# Patient Record
Sex: Female | Born: 1955 | Hispanic: No | Smoking: Never smoker
Health system: Southern US, Community
[De-identification: ages and names within clinical notes are randomized; demographics above are authoritative.]

## PROBLEM LIST (undated history)

## (undated) DIAGNOSIS — Z950 Presence of cardiac pacemaker: Secondary | ICD-10-CM

## (undated) DIAGNOSIS — I10 Essential (primary) hypertension: Secondary | ICD-10-CM

## (undated) DIAGNOSIS — Z95810 Presence of automatic (implantable) cardiac defibrillator: Secondary | ICD-10-CM

## (undated) DIAGNOSIS — G510 Bell's palsy: Secondary | ICD-10-CM

## (undated) DIAGNOSIS — I4891 Unspecified atrial fibrillation: Principal | ICD-10-CM

## (undated) HISTORY — PX: CHOLECYSTECTOMY: SHX55

## (undated) HISTORY — PX: TUBAL LIGATION: SHX77

---

## 2006-09-06 DIAGNOSIS — G510 Bell's palsy: Secondary | ICD-10-CM

## 2006-09-06 HISTORY — DX: Bell's palsy: G51.0

## 2007-09-07 DIAGNOSIS — Z95810 Presence of automatic (implantable) cardiac defibrillator: Secondary | ICD-10-CM

## 2007-09-07 DIAGNOSIS — I4891 Unspecified atrial fibrillation: Secondary | ICD-10-CM

## 2007-09-07 HISTORY — DX: Unspecified atrial fibrillation: I48.91

## 2007-09-07 HISTORY — PX: PACEMAKER INSERTION: SHX728

## 2007-09-07 HISTORY — DX: Presence of automatic (implantable) cardiac defibrillator: Z95.810

## 2011-12-07 ENCOUNTER — Encounter (HOSPITAL_COMMUNITY): Payer: Self-pay | Admitting: *Deleted

## 2011-12-07 ENCOUNTER — Emergency Department (INDEPENDENT_AMBULATORY_CARE_PROVIDER_SITE_OTHER)
Admission: EM | Admit: 2011-12-07 | Discharge: 2011-12-07 | Disposition: A | Discharge status: Home / Self Care | Payer: Medicare Other | Source: Home / Self Care | Attending: Family Medicine | Admitting: Family Medicine

## 2011-12-07 DIAGNOSIS — I499 Cardiac arrhythmia, unspecified: Secondary | ICD-10-CM

## 2011-12-07 DIAGNOSIS — Z7901 Long term (current) use of anticoagulants: Secondary | ICD-10-CM

## 2011-12-07 DIAGNOSIS — K2970 Gastritis, unspecified, without bleeding: Secondary | ICD-10-CM

## 2011-12-07 DIAGNOSIS — K2990 Gastroduodenitis, unspecified, without bleeding: Secondary | ICD-10-CM

## 2011-12-07 HISTORY — DX: Essential (primary) hypertension: I10

## 2011-12-07 HISTORY — DX: Presence of cardiac pacemaker: Z95.0

## 2011-12-07 MED ORDER — WARFARIN SODIUM 1 MG PO TABS: ORAL_TABLET | ORAL | Status: DC

## 2011-12-07 NOTE — ED Notes (Signed)
Pt lives in [place] and needs PT/INR Q 2 weeks due to taking coumadin.    She has an Rx from her Dr for this since she is visiting family here.  She also c/o 4 days of epigastric pain and mid back pain which is burning.  The pain is worse after eating

## 2011-12-07 NOTE — Discharge Instructions (Signed)
Take an additional 2.5 mg tablet in addition to your normal 5 mg tablet tonight. Take one of the 1 mg tablets in addition to your 5 mg tablet tomorrow night. Then continue your normal regimen. Please present here next Tuesday or Wednesday for another INR check, with Dr. Juanetta Gosling. Return to care, sooner should, your symptoms worsen or you experience any spontaneous bleeding.  For your stomach pain, you may increase your Zantac dose to 150 mg twice daily for up to two weeks to see if your symptoms improve. Return to care should your symptoms not improve, or worsen in any way.

## 2011-12-07 NOTE — ED Provider Notes (Signed)
History     CSN: [id number]  Arrival date & time 12/07/11  8657   First MD Initiated Contact with Patient 12/07/11 1011      Chief Complaint  Patient presents with  . Medication Refill    (Consider location/radiation/quality/duration/timing/severity/associated sxs/prior treatment) HPI Comments: Beth May presents for the need for laboratory results. She is visiting here from [place] and has been here since December. She's currently taking warfarin for an arrhythmia and also has a pacemaker. She presents a prescription from her cardiologist requesting biweekly INR checks. She misunderstood the directions and has not had her INR checked as of yet. This was supposed to occur starting in December. We will check it today. She denies any chest complaints. She does report some epigastric discomfort. This sometimes occurs after eating.  Patient is a 56 y.o. female presenting with general illness. The history is provided by the patient.  Illness  The current episode started more than 2 weeks ago. The problem has been unchanged. The symptoms are relieved by nothing. The symptoms are aggravated by nothing. Associated symptoms include abdominal pain. Pertinent negatives include no fever.    Past Medical History  Diagnosis Date  . Pacemaker   . Hypertension     Past Surgical History  Procedure Date  . Pacemaker insertion   . Cesarean section     Family History  Problem Relation Age of Onset  . Cancer Mother     History  Substance Use Topics  . Smoking status: Never Smoker   . Smokeless tobacco: Not on file  . Alcohol Use: No    OB History    Grav Para Term Preterm Abortions TAB SAB Ect Mult Living                  Review of Systems  Constitutional: Negative.  Negative for fever.  HENT: Negative.   Eyes: Negative.   Respiratory: Negative.  Negative for chest tightness.   Cardiovascular: Negative.  Negative for chest pain, palpitations and leg swelling.  Gastrointestinal:  Positive for abdominal pain.  Genitourinary: Negative.   Musculoskeletal: Negative.   Skin: Negative.   Neurological: Negative.     Allergies  Review of patient's allergies indicates no known allergies.  Home Medications   Current Outpatient Rx  Name Route Sig Dispense Refill  . FUROSEMIDE 20 MG PO TABS Oral Take 20 mg by mouth 2 (two) times daily.    Marland Kitchen POTASSIUM CHLORIDE ER 10 MEQ PO TBCR Oral Take 10 mEq by mouth 2 (two) times daily.    . WARFARIN SODIUM 2.5 MG PO TABS Oral Take 2.5 mg by mouth as directed.    . WARFARIN SODIUM 5 MG PO TABS Oral Take 5 mg by mouth daily.    . WARFARIN SODIUM 1 MG PO TABS  Take one tablet in addition to your normal 5 mg tomorrow night; and as directed by your physician. 30 tablet 0    BP 120/82  Pulse 71  Temp(Src) 98.1 F (36.7 C) (Oral)  Resp 16  SpO2 9%  Physical Exam  Nursing note and vitals reviewed. Constitutional: She is oriented to person, place, and time. She appears well-developed and well-nourished.  HENT:  Head: Normocephalic and atraumatic.  Eyes: EOM are normal.  Neck: Normal range of motion.  Cardiovascular: Normal rate, regular rhythm and normal heart sounds.   Pulmonary/Chest: Effort normal and breath sounds normal. She has no wheezes. She has no rales.  Abdominal: Soft. Normal appearance and bowel sounds are normal.  There is no tenderness.  Musculoskeletal: Normal range of motion.  Neurological: She is alert and oriented to person, place, and time.  Skin: Skin is warm and dry.  Psychiatric: Her behavior is normal.    ED Course  Procedures (including critical care time)  Labs Reviewed  PROTIME-INR - Abnormal; Notable for the following:    Prothrombin Time 18.5 (*)    INR 1.51 (*)    All other components within normal limits   No results found.   1. Arrhythmia   2. Warfarin anticoagulation   3. Gastritis       MDM  Adjusted warfarin dosage by 10 percent, for subtherapeutic INR; will return Tuesday for  INR check        Renaee Munda, MD 12/07/11 1235

## 2011-12-14 ENCOUNTER — Emergency Department (INDEPENDENT_AMBULATORY_CARE_PROVIDER_SITE_OTHER)
Admission: EM | Admit: 2011-12-14 | Discharge: 2011-12-14 | Disposition: A | Discharge status: Home / Self Care | Payer: Medicare Other | Source: Home / Self Care | Attending: Family Medicine | Admitting: Family Medicine

## 2011-12-14 ENCOUNTER — Encounter (HOSPITAL_COMMUNITY): Payer: Self-pay

## 2011-12-14 DIAGNOSIS — Z7901 Long term (current) use of anticoagulants: Secondary | ICD-10-CM

## 2011-12-14 LAB — PROTIME-INR: Prothrombin Time: 23.3 seconds — ABNORMAL HIGH (ref 11.6–15.2)

## 2011-12-14 NOTE — ED Notes (Signed)
Pt states she was seen here 1 week ago. Was told by Dr Juanetta Gosling to return to have bleeding time recheck.  States he adjusted the dose of her medicine.

## 2011-12-14 NOTE — Discharge Instructions (Signed)
I need to increase the dose of your coumadin medication today. Tonight, take an additional 1 mg tablet of coumadin in addition to your regular 5 mg tablet. Repeat this tomorrow night, taking a 1 mg tablet in addition to your regular 5 mg tablet. After tomorrow, continue with your regular dosing, 5 mg each night except for Sunday, where you take 2.5 mg. Return to care on Monday or Thursday for re-evaluation with Dr. Juanetta Gosling and to have your INR checked.

## 2011-12-14 NOTE — ED Provider Notes (Signed)
History     CSN: [id number]  Arrival date & time 12/14/11  0945   First MD Initiated Contact with Patient 12/14/11 1050      Chief Complaint  Patient presents with  . Labs Only    (Consider location/radiation/quality/duration/timing/severity/associated sxs/prior treatment) HPI Comments: Beth May presents today for reevaluation of her Coumadin regimen. She was seen last week by this provider for an INR check. She is here visiting her family from [place] since December. At that time she presented a prescription from her cardiologist requesting INR checks every 2 weeks for 6 weeks. She had misunderstood the directions and presented for the first time in March with no previous INR checks since November. INR that time was 1.5. Her regimen was increased by 2 mg and she was told to followup today. INR today is 2.03. Will increase regimen by 10% again and have her followup in one week.  Patient is a 56 y.o. female presenting with general illness. The history is provided by the patient.  Illness  The problem has been unchanged.    Past Medical History  Diagnosis Date  . Pacemaker   . Hypertension     Past Surgical History  Procedure Date  . Pacemaker insertion   . Cesarean section     Family History  Problem Relation Age of Onset  . Cancer Mother     History  Substance Use Topics  . Smoking status: Never Smoker   . Smokeless tobacco: Not on file  . Alcohol Use: No    OB History    Grav Para Term Preterm Abortions TAB SAB Ect Mult Living                  Review of Systems  Constitutional: Negative.   HENT: Negative.   Eyes: Negative.   Respiratory: Negative.   Cardiovascular: Negative.   Gastrointestinal: Negative.   Genitourinary: Negative.   Musculoskeletal: Negative.   Skin: Negative.   Neurological: Negative.     Allergies  Review of patient's allergies indicates no known allergies.  Home Medications   Current Outpatient Rx  Name Route Sig Dispense  Refill  . FUROSEMIDE 20 MG PO TABS Oral Take 20 mg by mouth 2 (two) times daily.    Marland Kitchen POTASSIUM CHLORIDE ER 10 MEQ PO TBCR Oral Take 10 mEq by mouth 2 (two) times daily.    . WARFARIN SODIUM 1 MG PO TABS  Take one tablet in addition to your normal 5 mg tomorrow night; and as directed by your physician. 30 tablet 0  . WARFARIN SODIUM 2.5 MG PO TABS Oral Take 2.5 mg by mouth as directed.    . WARFARIN SODIUM 5 MG PO TABS Oral Take 5 mg by mouth daily.      BP 109/78  Pulse 72  Temp(Src) 98.3 F (36.8 C) (Oral)  Resp 16  SpO2 96%  Physical Exam  Nursing note and vitals reviewed. Constitutional: She is oriented to person, place, and time. She appears well-developed and well-nourished.  HENT:  Head: Normocephalic and atraumatic.  Eyes: EOM are normal.  Neck: Normal range of motion.  Pulmonary/Chest: Effort normal.  Musculoskeletal: Normal range of motion.  Neurological: She is alert and oriented to person, place, and time.  Skin: Skin is warm and dry.  Psychiatric: Her behavior is normal.    ED Course  Procedures (including critical care time)  Labs Reviewed  PROTIME-INR - Abnormal; Notable for the following:    Prothrombin Time 23.3 (*)  INR 2.03 (*)    All other components within normal limits   No results found.   1. Warfarin anticoagulation       MDM  Labs reviewed; improvement in INR with increase of 2 mg last week; will increase 1 mg each night, tonight and tomorrow; will re-check in 1 week       Renaee Munda, MD 12/14/11 [phone]

## 2011-12-20 ENCOUNTER — Encounter (HOSPITAL_COMMUNITY): Payer: Self-pay

## 2011-12-20 ENCOUNTER — Emergency Department (INDEPENDENT_AMBULATORY_CARE_PROVIDER_SITE_OTHER)
Admission: EM | Admit: 2011-12-20 | Discharge: 2011-12-20 | Disposition: A | Discharge status: Home / Self Care | Payer: Medicare Other | Source: Home / Self Care | Attending: Family Medicine | Admitting: Family Medicine

## 2011-12-20 DIAGNOSIS — Z7901 Long term (current) use of anticoagulants: Secondary | ICD-10-CM

## 2011-12-20 LAB — PROTIME-INR: INR: 2.58 — ABNORMAL HIGH (ref 0.00–1.49)

## 2011-12-20 NOTE — ED Notes (Signed)
Was seen here last week by Dr Juanetta Gosling to have INR drawn with subsequent adjust in coumadin dosing.  Told to return to today for repeat INR

## 2011-12-20 NOTE — Discharge Instructions (Signed)
Regresa aqui a la clinica para una prueba de [place]; [employer] al lunes, el 29 de abril, con Dr. Juanetta Gosling, entre 8 en la manana y 4 en la tarde. Continua su medicina, con 5 mg de warfarin (Coumadin) cada noche.

## 2011-12-20 NOTE — ED Provider Notes (Signed)
History     CSN: [id number]  Arrival date & time 12/20/11  4098   First MD Initiated Contact with Patient 12/20/11 0957      Chief Complaint  Patient presents with  . Labs Only    (Consider location/radiation/quality/duration/timing/severity/associated sxs/prior treatment) HPI Comments: Beth May returns today for INR check. Her last reading was 2.03; her regimen was increased by 2.5 mg weekly. She is now taking 5 mg warfarin daily. She denies any complaints.   Patient is a 56 y.o. female presenting with general illness.  Illness  The current episode started more than 2 weeks ago. The problem has been unchanged.    Past Medical History  Diagnosis Date  . Pacemaker   . Hypertension     Past Surgical History  Procedure Date  . Pacemaker insertion   . Cesarean section     Family History  Problem Relation Age of Onset  . Cancer Mother     History  Substance Use Topics  . Smoking status: Never Smoker   . Smokeless tobacco: Not on file  . Alcohol Use: No    OB History    Grav Para Term Preterm Abortions TAB SAB Ect Mult Living                  Review of Systems  Constitutional: Negative.   HENT: Negative.   Eyes: Negative.   Respiratory: Negative.   Cardiovascular: Negative.   Gastrointestinal: Negative.   Genitourinary: Negative.   Musculoskeletal: Negative.   Skin: Negative.   Neurological: Negative.   Hematological: Negative.  Does not bruise/bleed easily.    Allergies  Review of patient's allergies indicates no known allergies.  Home Medications   Current Outpatient Rx  Name Route Sig Dispense Refill  . FUROSEMIDE 20 MG PO TABS Oral Take 20 mg by mouth 2 (two) times daily.    Marland Kitchen POTASSIUM CHLORIDE ER 10 MEQ PO TBCR Oral Take 10 mEq by mouth 2 (two) times daily.    . WARFARIN SODIUM 5 MG PO TABS Oral Take 5 mg by mouth daily.      BP 115/77  Pulse 72  Temp(Src) 98.4 F (36.9 C) (Oral)  Resp 14  SpO2 98%  Physical Exam  Nursing note and  vitals reviewed. Constitutional: She is oriented to person, place, and time. She appears well-developed and well-nourished.  HENT:  Head: Normocephalic and atraumatic.  Eyes: EOM are normal.  Neck: Normal range of motion.  Pulmonary/Chest: Effort normal.  Musculoskeletal: Normal range of motion.  Neurological: She is alert and oriented to person, place, and time.  Skin: Skin is warm and dry.  Psychiatric: Her behavior is normal.    ED Course  Procedures (including critical care time)  Labs Reviewed  PROTIME-INR - Abnormal; Notable for the following:    Prothrombin Time 28.1 (*)    INR 2.58 (*)    All other components within normal limits   No results found.   1. Warfarin anticoagulation       MDM  Labs reviewed; continue current regimen at warfarin 5 mg daily; return in 2 weeks for INR check        Renaee Munda, MD 12/20/11 1153

## 2011-12-21 NOTE — ED Notes (Signed)
4/9 Dr. Juanetta Gosling wants to know if the [facility] would see the pt. 4/11 Office closed. 4/15 Left message.  4/16 I explained to the registration clerk that pt. is visiting from [place] for the next 3 mos.  She said she thinks you have to be a [place] pt. to come to the clinic.  She transferred me to the VM. Left message to call. Vassie Moselle 12/21/2011

## 2011-12-23 NOTE — ED Notes (Signed)
Dr. Juanetta Gosling made aware coumadin clinic refuses to see pt without being a [facility] patient first.  Dr. Juanetta Gosling states he's stabilized and will see at [facility] one more time before she goes back to [facility].

## 2012-01-03 ENCOUNTER — Encounter (HOSPITAL_COMMUNITY): Payer: Self-pay

## 2012-01-03 ENCOUNTER — Emergency Department (INDEPENDENT_AMBULATORY_CARE_PROVIDER_SITE_OTHER)
Admission: EM | Admit: 2012-01-03 | Discharge: 2012-01-03 | Disposition: A | Discharge status: Home / Self Care | Payer: Medicare Other | Source: Home / Self Care | Attending: Family Medicine | Admitting: Family Medicine

## 2012-01-03 DIAGNOSIS — I4891 Unspecified atrial fibrillation: Secondary | ICD-10-CM

## 2012-01-03 DIAGNOSIS — Z7901 Long term (current) use of anticoagulants: Secondary | ICD-10-CM

## 2012-01-03 HISTORY — DX: Unspecified atrial fibrillation: I48.91

## 2012-01-03 LAB — PROTIME-INR
INR: 2.93 — ABNORMAL HIGH (ref 0.00–1.49)
Prothrombin Time: 31 seconds — ABNORMAL HIGH (ref 11.6–15.2)

## 2012-01-03 NOTE — ED Notes (Signed)
Pt here to have INR checked as instructed by Dr Juanetta Gosling.

## 2012-01-03 NOTE — ED Provider Notes (Signed)
History     CSN: [id number]  Arrival date & time 01/03/12  1016   First MD Initiated Contact with Patient 01/03/12 1039      Chief Complaint  Patient presents with  . Labs Only    (Consider location/radiation/quality/duration/timing/severity/associated sxs/prior treatment) HPI Comments: Seraiah returns today for recheck of her INR. She is visiting from [place] and has been here since December 2012. She is followed by her cardiologist in [place] and was recently started on warfarin in November 2012. She arrived in [place] with a prescription from her cardiologist to have biweekly warfarin checks. She misunderstood the instructions and had never had it checked. At that time, her regimen was warfarin 5 mg daily, M - Sat, and 2.5 mg on Sunday. She has been returning here to have it managed by this provider. After an increase of 3 mg (1 additional mg each night for 3 nights) during week 1, and then an additional 3 mg, the second week, increasing 10% weekly, the last 2 INRs were 2.5 and 2.93. She did experience a nosebleed once last week, and went back down to her previous regimen, where she only took 2.5 mg on Sunday. I advised her to continue with the new regimen, of 5 mg daily except for an increase to 3.5 mg on Sunday. I had prescribed 1 mg tablets. She will follow up here once more in two weeks, for one last INR check.  Patient is a 56 y.o. female presenting with general illness. The history is provided by the patient.  Illness  The current episode started more than 2 weeks ago.    Past Medical History  Diagnosis Date  . Pacemaker   . Hypertension   . Atrial fibrillation     Past Surgical History  Procedure Date  . Pacemaker insertion   . Cesarean section     Family History  Problem Relation Age of Onset  . Cancer Mother     History  Substance Use Topics  . Smoking status: Never Smoker   . Smokeless tobacco: Not on file  . Alcohol Use: No    OB History    Grav Para Term Preterm Abortions  TAB SAB Ect Mult Living                  Review of Systems  Constitutional: Negative.   HENT: Positive for nosebleeds.   Eyes: Negative.   Respiratory: Negative.   Cardiovascular: Negative.   Gastrointestinal: Negative.   Genitourinary: Negative.   Musculoskeletal: Negative.   Skin: Negative.   Neurological: Negative.     Allergies  Review of patient's allergies indicates no known allergies.  Home Medications   Current Outpatient Rx  Name Route Sig Dispense Refill  . FUROSEMIDE 20 MG PO TABS Oral Take 20 mg by mouth 2 (two) times daily.    Marland Kitchen POTASSIUM CHLORIDE ER 10 MEQ PO TBCR Oral Take 10 mEq by mouth 2 (two) times daily.    . WARFARIN SODIUM 5 MG PO TABS Oral Take 5 mg by mouth daily.      BP 125/71  Pulse 60  Temp(Src) 98.3 F (36.8 C) (Oral)  Resp 16  SpO2 98%  Physical Exam  Nursing note and vitals reviewed. Constitutional: She is oriented to person, place, and time. She appears well-developed and well-nourished.  HENT:  Head: Normocephalic and atraumatic.  Eyes: EOM are normal.  Neck: Normal range of motion.  Pulmonary/Chest: Effort normal.  Musculoskeletal: Normal range of motion.  Neurological: She is  alert and oriented to person, place, and time.  Skin: Skin is warm and dry.  Psychiatric: Her behavior is normal.    ED Course  Procedures (including critical care time)  Labs Reviewed  PROTIME-INR - Abnormal; Notable for the following:    Prothrombin Time 31.0 (*)    INR 2.93 (*)    All other components within normal limits  LAB REPORT - SCANNED   No results found.   1. Atrial fibrillation   2. Warfarin anticoagulation       MDM  Labs reviewed; INR stable, with last 2 values 2.5 and 2.93; will continue regimen of 5 mg daily, and 3.5 mg on Sunday; return in 2 weeks for recheck        Renaee Munda, MD 01/06/12 1318

## 2012-01-03 NOTE — Discharge Instructions (Signed)
Take 5 mg warfarin EVERY DAY, EXCEPT Sunday. On Sunday, take one of your regular 2.5 mg tablets, IN ADDITION TO a 1 mg tablet. So, on Sunday, you will take a total of 3.5 mg. Please return here to the [facility] for an INR check in 2 to 3 weeks. Return to care sooner than that time should your symptoms not improve, or worsen in any way, such as spontaneous bleeding, chest pain, difficulty breathing, or any other symptoms that might concern you.

## 2012-01-24 ENCOUNTER — Emergency Department (INDEPENDENT_AMBULATORY_CARE_PROVIDER_SITE_OTHER)
Admission: EM | Admit: 2012-01-24 | Discharge: 2012-01-24 | Disposition: A | Discharge status: Home / Self Care | Payer: Medicare Other | Source: Home / Self Care | Attending: Family Medicine | Admitting: Family Medicine

## 2012-01-24 ENCOUNTER — Encounter (HOSPITAL_COMMUNITY): Payer: Self-pay | Admitting: Cardiology

## 2012-01-24 DIAGNOSIS — Z5181 Encounter for therapeutic drug level monitoring: Secondary | ICD-10-CM

## 2012-01-24 LAB — PROTIME-INR: INR: 3.1 — ABNORMAL HIGH (ref 0.00–1.49)

## 2012-01-24 NOTE — Discharge Instructions (Signed)
Continue the same dose and dose schedule. Follow up 2 wks for recheck inr. It may be more convenient for your cardiologist to write a order for inr monitoring and you go to [facility] to have it drawn . They will then report results to your cardiologist and he can manage any changes.

## 2012-01-24 NOTE — ED Provider Notes (Signed)
History     CSN: [id number]  Arrival date & time 01/24/12  0917   First MD Initiated Contact with Patient 01/24/12 1001      Chief Complaint  Patient presents with  . Anticoagulation    (Consider location/radiation/quality/duration/timing/severity/associated sxs/prior treatment) HPI Comments: Old records reviewed. Patient her on prolonged visit from [place]. Was placed on coumadin by her cardiologist due to afib. Has been coming here and having inr checked and meds adjusted by Dr. Juanetta Gosling who no longer works here. She was to have been here a wk ago for last visit. She states she was unable to come and she is due to return to [place] on the 10th of June. She denies any complications. Upon questioning the patient she revealed that she will be coming and going from [place] and [place] often. We will attempt to get her to the coumadin clinic for future monitoring.   The history is provided by the patient.    Past Medical History  Diagnosis Date  . Pacemaker   . Hypertension   . Atrial fibrillation     Past Surgical History  Procedure Date  . Pacemaker insertion   . Cesarean section     Family History  Problem Relation Age of Onset  . Cancer Mother     History  Substance Use Topics  . Smoking status: Never Smoker   . Smokeless tobacco: Not on file  . Alcohol Use: No    OB History    Grav Para Term Preterm Abortions TAB SAB Ect Mult Living                  Review of Systems  Constitutional: Negative.   HENT: Negative.   Eyes: Negative.   Respiratory: Negative.   Cardiovascular: Negative.   Gastrointestinal: Negative.   Neurological: Negative.   Hematological: Negative.     Allergies  Review of patient's allergies indicates no known allergies.  Home Medications   Current Outpatient Rx  Name Route Sig Dispense Refill  . FUROSEMIDE 20 MG PO TABS Oral Take 20 mg by mouth 2 (two) times daily.    Marland Kitchen POTASSIUM CHLORIDE ER 10 MEQ PO TBCR Oral Take 10 mEq by mouth 2  (two) times daily.    . WARFARIN SODIUM 5 MG PO TABS Oral Take 5 mg by mouth daily.      BP 129/76  Pulse 60  Temp(Src) 98.4 F (36.9 C) (Oral)  Resp 18  SpO2 97%  Physical Exam  Nursing note and vitals reviewed. Constitutional: She appears well-developed and well-nourished. No distress.  Neck: Normal range of motion. Neck supple. No thyromegaly present.  Cardiovascular: Normal rate, regular rhythm and normal heart sounds.   Pulmonary/Chest: Effort normal and breath sounds normal.  Musculoskeletal: She exhibits no edema.  Lymphadenopathy:    She has no cervical adenopathy.  Skin: Skin is warm and dry. No rash noted.    ED Course  Procedures (including critical care time)  Labs Reviewed  PROTIME-INR - Abnormal; Notable for the following:    Prothrombin Time 32.4 (*)    INR 3.10 (*)    All other components within normal limits   No results found. Maintain same dosing and schedule 1. Encounter for monitoring coumadin therapy       MDM          Beth Spike, MD 01/24/12 1233

## 2012-01-24 NOTE — ED Notes (Addendum)
Pt here to have INR check as instructed per Dr Juanetta Gosling. Pt is visiting the area from [place] and does not have doctor in this area.Pt reports she takes Coumadin 5mg  daily except 3 mg on Sundays. No abnormal bleeding or bruising. Pt reports green intake to be inconsistent. Denies abx use.

## 2012-05-10 ENCOUNTER — Emergency Department (INDEPENDENT_AMBULATORY_CARE_PROVIDER_SITE_OTHER)
Admission: EM | Admit: 2012-05-10 | Discharge: 2012-05-10 | Disposition: A | Discharge status: Home / Self Care | Payer: PRIVATE HEALTH INSURANCE | Source: Home / Self Care | Attending: Family Medicine | Admitting: Family Medicine

## 2012-05-10 ENCOUNTER — Encounter (HOSPITAL_COMMUNITY): Payer: Self-pay | Admitting: Emergency Medicine

## 2012-05-10 DIAGNOSIS — K5289 Other specified noninfective gastroenteritis and colitis: Secondary | ICD-10-CM

## 2012-05-10 DIAGNOSIS — K529 Noninfective gastroenteritis and colitis, unspecified: Secondary | ICD-10-CM

## 2012-05-10 LAB — POCT I-STAT, CHEM 8
BUN: 19 mg/dL (ref 6–23)
Calcium, Ion: 1.15 mmol/L (ref 1.12–1.23)
Hemoglobin: 16 g/dL — ABNORMAL HIGH (ref 12.0–15.0)
TCO2: 27 mmol/L (ref 0–100)

## 2012-05-10 MED ORDER — OMEPRAZOLE 20 MG PO CPDR: 20.0000 mg | DELAYED_RELEASE_CAPSULE | Freq: Every day | ORAL | Status: AC

## 2012-05-10 MED ORDER — LOPERAMIDE HCL 2 MG PO CAPS: 2.0000 mg | ORAL_CAPSULE | Freq: Four times a day (QID) | ORAL | Status: AC | PRN

## 2012-05-10 MED ORDER — ONDANSETRON HCL 4 MG PO TABS: 4.0000 mg | ORAL_TABLET | Freq: Three times a day (TID) | ORAL | Status: AC | PRN

## 2012-05-10 NOTE — ED Notes (Signed)
Pt c/o vomiting since Sunday and Monday but it has gotten better yesterday. Pt had one episode of diarrhea. Pt feels like she has fever severe body aches especially in abdomen and back. Pt has no complaints of dysuria, cough, congestion.

## 2012-05-11 NOTE — ED Provider Notes (Signed)
History     CSN: [id number]  Arrival date & time 05/10/12  7829   First MD Initiated Contact with Patient 05/10/12 [phone]      Chief Complaint  Patient presents with  . Emesis    (Consider location/radiation/quality/duration/timing/severity/associated sxs/prior treatment) HPI Comments: 56 year old female with history of hypertension and A. fib on chronic anticoagulation with Coumadin. Here complaining of nausea and vomiting, abdominal cramping and diarrhea for 2 days. Last emesis was yesterday evening. Has had one large episode of loose stools today. Denies melena or red blood in her stools. Patient reports eating fish in a restaurant just a few are as before her symptoms started. Denies fever or chills. No headache or dizziness. No dysuria or hematuria no cough or congestion. She takes Lasix and potassium supplementation as part of her chronic medications.   Past Medical History  Diagnosis Date  . Pacemaker   . Hypertension   . Atrial fibrillation     Past Surgical History  Procedure Date  . Pacemaker insertion   . Cesarean section     Family History  Problem Relation Age of Onset  . Cancer Mother     History  Substance Use Topics  . Smoking status: Never Smoker   . Smokeless tobacco: Not on file  . Alcohol Use: No    OB History    Grav Para Term Preterm Abortions TAB SAB Ect Mult Living                  Review of Systems  Constitutional: Positive for appetite change and fatigue. Negative for fever and chills.  HENT: Negative for congestion and neck pain.   Respiratory: Negative for cough and shortness of breath.   Cardiovascular: Negative for chest pain, palpitations and leg swelling.  Gastrointestinal: Positive for nausea, vomiting and diarrhea. Negative for constipation, blood in stool and abdominal distention.  Genitourinary: Negative for dysuria and hematuria.  Skin: Negative for rash.  Neurological: Negative for dizziness and headaches.    Allergies    Review of patient's allergies indicates no known allergies.  Home Medications   Current Outpatient Rx  Name Route Sig Dispense Refill  . CARVEDILOL 12.5 MG PO TABS Oral Take 12.5 mg by mouth 2 (two) times daily with a meal.    . ENALAPRIL MALEATE 2.5 MG PO TABS Oral Take 2.5 mg by mouth daily.    . FUROSEMIDE 20 MG PO TABS Oral Take 20 mg by mouth 2 (two) times daily.    Marland Kitchen LOPERAMIDE HCL 2 MG PO CAPS Oral Take 1 capsule (2 mg total) by mouth 4 (four) times daily as needed for diarrhea or loose stools. 12 capsule 0  . OMEPRAZOLE 20 MG PO CPDR Oral Take 1 capsule (20 mg total) by mouth daily. 30 capsule 0  . ONDANSETRON HCL 4 MG PO TABS Oral Take 1 tablet (4 mg total) by mouth every 8 (eight) hours as needed for nausea. 10 tablet 0  . POTASSIUM CHLORIDE ER 10 MEQ PO TBCR Oral Take 10 mEq by mouth 2 (two) times daily.    . WARFARIN SODIUM 5 MG PO TABS Oral Take 5 mg by mouth daily.      BP 130/86  Pulse 91  Temp 98.9 F (37.2 C) (Oral)  Resp 20  SpO2 98%  Physical Exam  Nursing note and vitals reviewed. Constitutional: She is oriented to person, place, and time. She appears well-developed and well-nourished. No distress.  HENT:  Head: Normocephalic and atraumatic.  Mouth/Throat: Oropharynx  is clear and moist. No oropharyngeal exudate.  Eyes: Conjunctivae are normal. No scleral icterus.  Neck: Neck supple. No JVD present. No thyromegaly present.  Cardiovascular: Normal rate, regular rhythm and normal heart sounds.   No murmur heard. Pulmonary/Chest: Effort normal and breath sounds normal. No respiratory distress. She has no wheezes. She has no rales.  Abdominal: Soft. Bowel sounds are normal. She exhibits no distension and no mass. There is no rebound and no guarding.       Reported diffuse tenderness. No HSM.  Lymphadenopathy:    She has no cervical adenopathy.  Neurological: She is alert and oriented to person, place, and time.  Skin: No rash noted.    ED Course  Procedures  (including critical care time)  Labs Reviewed  POCT I-STAT, CHEM 8 - Abnormal; Notable for the following:    Glucose, Bld 122 (*)     Hemoglobin 16.0 (*)     HCT 47.0 (*)     All other components within normal limits  LAB REPORT - SCANNED   No results found.   1. Gastroenteritis       MDM  Normal electrolytes. Treated with ondansetron and loperamide. Encouraged hydration. Asked to return or go to the emergency department if persistent, new or worsening symptoms like not keeping fluids down despite following treatment.        Sharin Grave, MD 05/12/12 [phone]

## 2012-05-28 ENCOUNTER — Encounter (HOSPITAL_COMMUNITY): Payer: Self-pay | Admitting: *Deleted

## 2012-05-28 ENCOUNTER — Encounter (HOSPITAL_COMMUNITY): Payer: Self-pay | Admitting: Emergency Medicine

## 2012-05-28 ENCOUNTER — Emergency Department (HOSPITAL_COMMUNITY): Payer: Medicare (Managed Care)

## 2012-05-28 ENCOUNTER — Emergency Department (INDEPENDENT_AMBULATORY_CARE_PROVIDER_SITE_OTHER)
Admission: EM | Admit: 2012-05-28 | Discharge: 2012-05-28 | Disposition: A | Discharge status: Nursing Home (Includes ICF) | Payer: PRIVATE HEALTH INSURANCE | Source: Home / Self Care | Attending: Emergency Medicine | Admitting: Emergency Medicine

## 2012-05-28 ENCOUNTER — Inpatient Hospital Stay (HOSPITAL_COMMUNITY)
Admission: EM | Admit: 2012-05-28 | Discharge: 2012-06-02 | DRG: 445 | Disposition: A | Discharge status: Home / Self Care | Payer: Medicare (Managed Care) | Attending: Internal Medicine | Admitting: Internal Medicine

## 2012-05-28 DIAGNOSIS — Z01810 Encounter for preprocedural cardiovascular examination: Secondary | ICD-10-CM

## 2012-05-28 DIAGNOSIS — R17 Unspecified jaundice: Secondary | ICD-10-CM

## 2012-05-28 DIAGNOSIS — Z7901 Long term (current) use of anticoagulants: Secondary | ICD-10-CM

## 2012-05-28 DIAGNOSIS — R109 Unspecified abdominal pain: Secondary | ICD-10-CM

## 2012-05-28 DIAGNOSIS — K8043 Calculus of bile duct with acute cholecystitis with obstruction: Principal | ICD-10-CM | POA: Diagnosis present

## 2012-05-28 DIAGNOSIS — R7309 Other abnormal glucose: Secondary | ICD-10-CM | POA: Diagnosis present

## 2012-05-28 DIAGNOSIS — R7401 Elevation of levels of liver transaminase levels: Secondary | ICD-10-CM

## 2012-05-28 DIAGNOSIS — I4891 Unspecified atrial fibrillation: Secondary | ICD-10-CM

## 2012-05-28 DIAGNOSIS — K812 Acute cholecystitis with chronic cholecystitis: Secondary | ICD-10-CM

## 2012-05-28 DIAGNOSIS — R932 Abnormal findings on diagnostic imaging of liver and biliary tract: Secondary | ICD-10-CM | POA: Diagnosis present

## 2012-05-28 DIAGNOSIS — K8041 Calculus of bile duct with cholecystitis, unspecified, with obstruction: Secondary | ICD-10-CM | POA: Diagnosis present

## 2012-05-28 DIAGNOSIS — I495 Sick sinus syndrome: Secondary | ICD-10-CM | POA: Diagnosis present

## 2012-05-28 DIAGNOSIS — R7402 Elevation of levels of lactic acid dehydrogenase (LDH): Secondary | ICD-10-CM | POA: Diagnosis present

## 2012-05-28 DIAGNOSIS — K8050 Calculus of bile duct without cholangitis or cholecystitis without obstruction: Secondary | ICD-10-CM

## 2012-05-28 DIAGNOSIS — K819 Cholecystitis, unspecified: Secondary | ICD-10-CM

## 2012-05-28 DIAGNOSIS — I509 Heart failure, unspecified: Secondary | ICD-10-CM | POA: Diagnosis present

## 2012-05-28 DIAGNOSIS — E860 Dehydration: Secondary | ICD-10-CM

## 2012-05-28 DIAGNOSIS — I10 Essential (primary) hypertension: Secondary | ICD-10-CM | POA: Diagnosis present

## 2012-05-28 DIAGNOSIS — Z95810 Presence of automatic (implantable) cardiac defibrillator: Secondary | ICD-10-CM

## 2012-05-28 DIAGNOSIS — Z950 Presence of cardiac pacemaker: Secondary | ICD-10-CM

## 2012-05-28 DIAGNOSIS — K8309 Other cholangitis: Secondary | ICD-10-CM | POA: Diagnosis present

## 2012-05-28 DIAGNOSIS — I5022 Chronic systolic (congestive) heart failure: Secondary | ICD-10-CM | POA: Diagnosis present

## 2012-05-28 DIAGNOSIS — N83209 Unspecified ovarian cyst, unspecified side: Secondary | ICD-10-CM | POA: Diagnosis present

## 2012-05-28 HISTORY — DX: Bell's palsy: G51.0

## 2012-05-28 HISTORY — DX: Presence of automatic (implantable) cardiac defibrillator: Z95.810

## 2012-05-28 LAB — CBC WITH DIFFERENTIAL/PLATELET
Basophils Absolute: 0 10*3/uL (ref 0.0–0.1)
Basophils Relative: 0 % (ref 0–1)
Eosinophils Absolute: 1.8 10*3/uL — ABNORMAL HIGH (ref 0.0–0.7)
MCH: 29.9 pg (ref 26.0–34.0)
MCHC: 34.5 g/dL (ref 30.0–36.0)
Neutrophils Relative %: 53 % (ref 43–77)
Platelets: 437 10*3/uL — ABNORMAL HIGH (ref 150–400)
RBC: 4.68 MIL/uL (ref 3.87–5.11)

## 2012-05-28 LAB — COMPREHENSIVE METABOLIC PANEL
ALT: 403 U/L — ABNORMAL HIGH (ref 0–35)
AST: 195 U/L — ABNORMAL HIGH (ref 0–37)
Albumin: 3.7 g/dL (ref 3.5–5.2)
Alkaline Phosphatase: 313 U/L — ABNORMAL HIGH (ref 39–117)
Potassium: 3.8 mEq/L (ref 3.5–5.1)
Sodium: 135 mEq/L (ref 135–145)
Total Protein: 7.8 g/dL (ref 6.0–8.3)

## 2012-05-28 LAB — URINALYSIS, ROUTINE W REFLEX MICROSCOPIC
Hgb urine dipstick: NEGATIVE
Specific Gravity, Urine: 1.014 (ref 1.005–1.030)
pH: 6 (ref 5.0–8.0)

## 2012-05-28 LAB — URINE MICROSCOPIC-ADD ON

## 2012-05-28 LAB — POCT URINALYSIS DIP (DEVICE)
Glucose, UA: 100 mg/dL — AB
Hgb urine dipstick: NEGATIVE
Leukocytes, UA: NEGATIVE
Nitrite: NEGATIVE
Urobilinogen, UA: 1 mg/dL (ref 0.0–1.0)
pH: 5.5 (ref 5.0–8.0)

## 2012-05-28 MED ORDER — SODIUM CHLORIDE 0.9 % IV SOLN: INTRAVENOUS | Status: DC

## 2012-05-28 MED ORDER — SODIUM CHLORIDE 0.9 % IV SOLN
INTRAVENOUS | Status: DC
  Administered 2012-05-29: 01:00:00 via INTRAVENOUS

## 2012-05-28 MED ORDER — HYDROMORPHONE HCL PF 1 MG/ML IJ SOLN: 1.0000 mg | INTRAMUSCULAR | Status: DC | PRN

## 2012-05-28 MED ORDER — ONDANSETRON HCL 4 MG/2ML IJ SOLN: 4.0000 mg | Freq: Once | INTRAMUSCULAR | Status: DC

## 2012-05-28 MED ORDER — SODIUM CHLORIDE 0.9 % IV BOLUS (SEPSIS)
250.0000 mL | Freq: Once | INTRAVENOUS | Status: AC
  Administered 2012-05-28: 250 mL via INTRAVENOUS

## 2012-05-28 MED ORDER — ONDANSETRON HCL 4 MG/2ML IJ SOLN: 4.0000 mg | Freq: Three times a day (TID) | INTRAMUSCULAR | Status: DC | PRN

## 2012-05-28 MED ORDER — HYDROMORPHONE HCL PF 1 MG/ML IJ SOLN: 1.0000 mg | Freq: Once | INTRAMUSCULAR | Status: DC

## 2012-05-28 NOTE — ED Notes (Signed)
Unable to locate pt  

## 2012-05-28 NOTE — ED Notes (Signed)
Attempted IV start x 2.  Both attempts unsuccessful.  Paged IV team.

## 2012-05-28 NOTE — ED Notes (Signed)
Pt presented to [facility] this morning with c/o abd pain x 3 weeks.  Pt appears to have yellowing of the eyes.  Pt also states her urine is much darker than normal.  Pt reports normal BM today.  Denies N/V/D.  Pt states pain radiates from abd to back.

## 2012-05-28 NOTE — ED Notes (Signed)
Pt in route to CT scan.  

## 2012-05-28 NOTE — ED Notes (Signed)
Spoke to dr coll about patient 

## 2012-05-28 NOTE — ED Notes (Signed)
Patient has not felt good for 3 weeks.  Currently has epigastric pain, through to back.  Poor appetite.  Some nausea, no vomiting, denies diarrhea.  No pain with urination.

## 2012-05-28 NOTE — ED Notes (Signed)
Report received from Selena Batten, RN at urgent care. Pt has had 3 weeks of epigastric and back pain. Decreased appetite, tea colored urine, yellow sclera. MD at urgent care requesting further eval. Pt has Hx of pacemaker and a. Fib.

## 2012-05-28 NOTE — ED Notes (Signed)
Pt updated on wait time.  

## 2012-05-28 NOTE — ED Notes (Signed)
Pt called for room. No answer 

## 2012-05-28 NOTE — ED Notes (Signed)
Instructed to put on gown 

## 2012-05-28 NOTE — ED Notes (Signed)
Patient points to her mid abd as source of pain,  States the pain goes into her back as well.  Patient was seen at our ucc and sent for further eval.  Patient complains of nausea and weakness.  Patient denies diff voiding

## 2012-05-28 NOTE — ED Notes (Signed)
Patient sent to bathroom for urine specimen 

## 2012-05-28 NOTE — ED Provider Notes (Addendum)
History     CSN: [id number]  Arrival date & time 05/28/12  1219   First MD Initiated Contact with Patient 05/28/12 1222      Chief Complaint  Patient presents with  . Abdominal Pain    (Consider location/radiation/quality/duration/timing/severity/associated sxs/prior treatment) HPI Comments: Patient presents to urgent care describing that for about 3 weeks she continues to experience back pain and abdominal pain. "Feels like a burning sensation on my back", at times gets worse, sometimes on its own gets better. Have not had any appetite for the last 3 weeks. Have been noticing my "urine to look dark". Patient denies any fevers, chills or respiratory symptoms. Patient goes into describing that more than 2 weeks ago she came to urgent care as she thought she had eaten something contaminated at a local [place]. She was prescribed some medicines at that time he seemed to have helped some, but she feels her symptoms never went away.  Patient denies any diarrheas, urinary symptoms such as burning, pressure increase frequency, does describe her urine looking dark. Have not had any rectal bleeding or vomiting. 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  On further questioning patient denies any generalized pruritus, or changes in stool color.  Patient is a 56 y.o. female presenting with abdominal pain. The history is provided by the patient.  Abdominal Pain The primary symptoms of the illness include abdominal pain, fatigue and nausea. The primary symptoms of the illness do not include fever, shortness of breath, vomiting, diarrhea, hematemesis, hematochezia or dysuria. The onset of the illness was gradual. The problem has been gradually worsening.  Additional symptoms associated with the illness include anorexia. Symptoms associated with the illness do not include constipation or frequency. Associated symptoms comments:  Dark looking urine. Significant associated medical issues include cardiac disease. Significant associated medical issues do not include GERD, inflammatory bowel disease, gallstones, liver disease, substance abuse or diverticulitis.    Past Medical History  Diagnosis Date  . Pacemaker   . Hypertension   . Atrial fibrillation     Past Surgical History  Procedure Date  . Pacemaker insertion   . Cesarean section     Family History  Problem Relation Age of Onset  . Cancer Mother     History  Substance Use Topics  . Smoking status: Never Smoker   . Smokeless tobacco: Not on file  . Alcohol Use: No    OB History    Grav Para Term Preterm Abortions TAB SAB Ect Mult Living                  Review of Systems  Constitutional: Positive for appetite change, fatigue and unexpected weight change. Negative for fever.  Respiratory: Negative for shortness of breath.   Gastrointestinal: Positive for nausea, abdominal pain and anorexia. Negative for vomiting, diarrhea, constipation, hematochezia and hematemesis.  Genitourinary: Negative for dysuria, frequency, flank pain and difficulty urinating.       Dark urine  Skin: Negative for pallor and rash.    Allergies  Review of patient's allergies indicates no known allergies.  Home Medications   Current Outpatient Rx  Name Route Sig Dispense Refill  . CARVEDILOL 12.5 MG PO TABS Oral Take 12.5 mg by mouth 2 (two) times daily with a meal.    . ENALAPRIL MALEATE 2.5 MG PO TABS Oral Take 2.5 mg by mouth daily.    . FUROSEMIDE 20 MG  PO TABS Oral Take 20 mg by mouth 2 (two) times daily.    Marland Kitchen OMEPRAZOLE 20 MG PO CPDR Oral Take 1 capsule (20 mg total) by mouth daily. 30 capsule 0  . POTASSIUM CHLORIDE ER 10 MEQ PO TBCR Oral Take 10 mEq by mouth 2 (two) times daily.    . WARFARIN SODIUM 5 MG PO TABS Oral Take 5 mg by mouth daily.      BP 118/81  Pulse 72  Temp 97.6 F (36.4 C) (Oral)  Resp 20  SpO2 97%  Physical Exam  Nursing note  and vitals reviewed. Constitutional: Vital signs are normal. She appears well-developed.  Non-toxic appearance. She has a sickly appearance. She does not appear ill. No distress.  HENT:  Head: Normocephalic.  Eyes: EOM are normal. Pupils are equal, round, and reactive to light. Scleral icterus is present.  Pulmonary/Chest: Effort normal and breath sounds normal.  Abdominal: Soft. Bowel sounds are normal. She exhibits no distension and no mass. There is hepatomegaly. There is tenderness. There is no rigidity, no rebound, no guarding, no CVA tenderness, no tenderness at McBurney's point and negative Murphy's sign.    Neurological: She is alert.  Skin: No rash noted. No erythema.    ED Course  Procedures (including critical care time)  Labs Reviewed  POCT URINALYSIS DIP (DEVICE) - Abnormal; Notable for the following:    Glucose, UA 100 (*)     Bilirubin Urine LARGE (*)     Ketones, ur TRACE (*)     All other components within normal limits   No results found.   1. Icterus   2. Abdominal pain       MDM  Presents to urgent care (2nd visit), with unresolved abdominal pain. Clinically with icterus and hyperbilirubinuria. Differential diagnoses includes hepatobiliary/pancreatic malignancies or an obstructive hepatobiliary condition. Patient symptomatic for 3 weeks including constitutional symptoms such as fatigue, anorexia and abdominal pain. Transfer patient in stable condition to the emergency department for further evaluation to be considered for further imaging studies of her abdomen.    Jimmie Molly, MD 05/28/12 1356  Jimmie Molly, MD 05/28/12 1357

## 2012-05-28 NOTE — ED Provider Notes (Addendum)
History     CSN: [id number]  Arrival date & time 05/28/12  1401   First MD Initiated Contact with Patient 05/28/12 1616      Chief Complaint  Patient presents with  . Abdominal Pain  . Back Pain    (Consider location/radiation/quality/duration/timing/severity/associated sxs/prior treatment) The history is provided by the patient.   patient is a 56 year old female with 2 week history of right upper car, pain has been constant for the past 2 days described as sharp and as an ache. Does radiate to her back. Associated with nausea and some mild loose bowel movements the past few days but no vomiting. Patient was seen in urgent care x2 for this complaint.  Past Medical History  Diagnosis Date  . Pacemaker   . Hypertension   . Atrial fibrillation     Past Surgical History  Procedure Date  . Pacemaker insertion   . Cesarean section     Family History  Problem Relation Age of Onset  . Cancer Mother     History  Substance Use Topics  . Smoking status: Never Smoker   . Smokeless tobacco: Not on file  . Alcohol Use: No    OB History    Grav Para Term Preterm Abortions TAB SAB Ect Mult Living                  Review of Systems  Constitutional: Positive for fever.  HENT: Negative for congestion and neck pain.   Eyes: Negative for redness.  Respiratory: Negative for shortness of breath.   Cardiovascular: Negative for chest pain.  Gastrointestinal: Positive for nausea, abdominal pain and diarrhea. Negative for vomiting.  Genitourinary: Negative for dysuria.  Musculoskeletal: Positive for back pain.  Skin: Negative for rash.  Neurological: Negative for headaches.  Hematological: Does not bruise/bleed easily.    Allergies  Review of patient's allergies indicates no known allergies.  Home Medications   Current Outpatient Rx  Name Route Sig Dispense Refill  . CARVEDILOL 12.5 MG PO TABS Oral Take 12.5 mg by mouth 2 (two) times daily with a meal.    . ENALAPRIL  MALEATE 2.5 MG PO TABS Oral Take 3.75 mg by mouth 2 (two) times daily.     Marland Kitchen OMEPRAZOLE 20 MG PO CPDR Oral Take 1 capsule (20 mg total) by mouth daily. 30 capsule 0  . POTASSIUM CHLORIDE ER 10 MEQ PO TBCR Oral Take 10 mEq by mouth daily.     . WARFARIN SODIUM 5 MG PO TABS Oral Take 2.5-5 mg by mouth every evening. Take 5mg  (one tablet) on all days EXCEPT on Thursday and Sunday. On Thursday and Sunday, take 2.5mg  (one-half tablet).      BP 129/79  Pulse 66  Temp 97.8 F (36.6 C) (Oral)  Resp 18  Ht 5\' 6"  (1.676 m)  Wt 187 lb (84.823 kg)  BMI 30.18 kg/m2  SpO2 99%  Physical Exam  Nursing note and vitals reviewed. Constitutional: She is oriented to person, place, and time. She appears well-developed and well-nourished. No distress.  HENT:  Head: Normocephalic and atraumatic.  Eyes: Conjunctivae normal and EOM are normal. Pupils are equal, round, and reactive to light. Scleral icterus is present.  Neck: Normal range of motion. Neck supple.  Cardiovascular: Normal rate, regular rhythm and normal heart sounds.   No murmur heard. Pulmonary/Chest: Effort normal and breath sounds normal. No respiratory distress. She has no wheezes. She has no rales.  Abdominal: Soft. Bowel sounds are normal. There is tenderness.  Mild tenderness right upper quadrant.  Musculoskeletal: Normal range of motion. She exhibits no edema.  Neurological: She is alert and oriented to person, place, and time. No cranial nerve deficit. She exhibits normal muscle tone. Coordination normal.  Skin: Skin is warm. No rash noted.    ED Course  Procedures (including critical care time)  Labs Reviewed  CBC WITH DIFFERENTIAL - Abnormal; Notable for the following:    Platelets 437 (*)     Eosinophils Relative 17 (*)     Eosinophils Absolute 1.8 (*)     All other components within normal limits  COMPREHENSIVE METABOLIC PANEL - Abnormal; Notable for the following:    Glucose, Bld 116 (*)     AST 195 (*)     ALT 403  (*)     Alkaline Phosphatase 313 (*)     Total Bilirubin 5.5 (*)     All other components within normal limits  URINALYSIS, ROUTINE W REFLEX MICROSCOPIC - Abnormal; Notable for the following:    Color, Urine ORANGE (*)  BIOCHEMICALS MAY BE AFFECTED BY COLOR   APPearance CLOUDY (*)     Bilirubin Urine LARGE (*)     Leukocytes, UA SMALL (*)     All other components within normal limits  URINE MICROSCOPIC-ADD ON - Abnormal; Notable for the following:    Squamous Epithelial / LPF MANY (*)     All other components within normal limits  LIPASE, BLOOD   Results for orders placed during the hospital encounter of 05/28/12  CBC WITH DIFFERENTIAL      Component Value Range   WBC 10.5  4.0 - 10.5 K/uL   RBC 4.68  3.87 - 5.11 MIL/uL   Hemoglobin 14.0  12.0 - 15.0 g/dL   HCT 16.1  09.6 - 04.5 %   MCV 86.8  78.0 - 100.0 fL   MCH 29.9  26.0 - 34.0 pg   MCHC 34.5  30.0 - 36.0 g/dL   RDW 40.9  81.1 - 91.4 %   Platelets 437 (*) 150 - 400 K/uL   Neutrophils Relative 53  43 - 77 %   Neutro Abs 5.5  1.7 - 7.7 K/uL   Lymphocytes Relative 22  12 - 46 %   Lymphs Abs 2.3  0.7 - 4.0 K/uL   Monocytes Relative 8  3 - 12 %   Monocytes Absolute 0.9  0.1 - 1.0 K/uL   Eosinophils Relative 17 (*) 0 - 5 %   Eosinophils Absolute 1.8 (*) 0.0 - 0.7 K/uL   Basophils Relative 0  0 - 1 %   Basophils Absolute 0.0  0.0 - 0.1 K/uL  COMPREHENSIVE METABOLIC PANEL      Component Value Range   Sodium 135  135 - 145 mEq/L   Potassium 3.8  3.5 - 5.1 mEq/L   Chloride 99  96 - 112 mEq/L   CO2 25  19 - 32 mEq/L   Glucose, Bld 116 (*) 70 - 99 mg/dL   BUN 9  6 - 23 mg/dL   Creatinine, Ser 7.82  0.50 - 1.10 mg/dL   Calcium 9.7  8.4 - 95.6 mg/dL   Total Protein 7.8  6.0 - 8.3 g/dL   Albumin 3.7  3.5 - 5.2 g/dL   AST 213 (*) 0 - 37 U/L   ALT 403 (*) 0 - 35 U/L   Alkaline Phosphatase 313 (*) 39 - 117 U/L   Total Bilirubin 5.5 (*) 0.3 - 1.2 mg/dL   GFR  calc non Af Amer >90  >90 mL/min   GFR calc Af Amer >90  >90 mL/min    LIPASE, BLOOD      Component Value Range   Lipase 38  11 - 59 U/L  URINALYSIS, ROUTINE W REFLEX MICROSCOPIC      Component Value Range   Color, Urine ORANGE (*) YELLOW   APPearance CLOUDY (*) CLEAR   Specific Gravity, Urine 1.014  1.005 - 1.030   pH 6.0  5.0 - 8.0   Glucose, UA NEGATIVE  NEGATIVE mg/dL   Hgb urine dipstick NEGATIVE  NEGATIVE   Bilirubin Urine LARGE (*) NEGATIVE   Ketones, ur NEGATIVE  NEGATIVE mg/dL   Protein, ur NEGATIVE  NEGATIVE mg/dL   Urobilinogen, UA 0.2  0.0 - 1.0 mg/dL   Nitrite NEGATIVE  NEGATIVE   Leukocytes, UA SMALL (*) NEGATIVE  URINE MICROSCOPIC-ADD ON      Component Value Range   Squamous Epithelial / LPF MANY (*) RARE   WBC, UA 3-6  <3 WBC/hpf   Bacteria, UA RARE  RARE    Date: 05/28/2012  Rate: 69  Rhythm: Paced rhythm  QRS Axis: Paced rhythm  Intervals: Paced rhythm  ST/T Wave abnormalities: Nonspecific ST and T wave changes.  Conduction Disutrbances: Paced rhythm  Narrative Interpretation:   Old EKG Reviewed:  No Oldher work as you or he is a the driver of a call to all of her back in a will 6969 EKG for comparison.    Dg Chest 2 View  05/28/2012  *RADIOLOGY REPORT*  Clinical Data: 56 year old female abdominal pain, back pain, jaundice.  CHEST - 2 VIEW  Comparison: None.  Findings: Left chest cardiac AICD.  Somewhat low lung volumes. Cardiac size and mediastinal contours are within normal limits. Visualized tracheal air column is within normal limits.  No pneumothorax, pulmonary edema or pleural effusion.  No pneumoperitoneum.  Mild scoliosis. No acute osseous abnormality identified.  IMPRESSION: Low lung volumes, otherwise no acute cardiopulmonary abnormality.   Original Report Authenticated By: Harley Hallmark, M.D.    Ct Abdomen Pelvis W Contrast  05/28/2012  *RADIOLOGY REPORT*  Clinical Data: Epigastric pain.  Pain radiating to the back.  Poor appetite.  Nausea.  No vomiting.  CT ABDOMEN AND PELVIS WITH CONTRAST  Technique:   Multidetector CT imaging of the abdomen and pelvis was performed following the standard protocol during bolus administration of intravenous contrast.  Contrast:  100 ml Omnipaque-300.  Comparison: None.  Findings: Lung Bases: Dependent atelectasis.  No airspace disease.        Liver:  Mild intrahepatic biliary ductal dilation.  Correlation with liver function studies and bilirubin recommended.  No mass lesion.  Portal vein appears normal.  Spleen:  Normal.  Gallbladder:  Hydropic with probable wall thickening and pericholecystic stranding.   Small amount of pericholecystic fluid suggest acute cholecystitis.  Common bile duct:  No common duct stone identified. Normal diameter.  Pancreas:  Normal.  Adrenal glands:  Normal.  Kidneys:  Normal enhancement and excretion.  Stomach:  Small hiatal hernia.  No inflammatory changes.  Small bowel:  No mesenteric adenopathy.  No obstruction.  Colon:   Normal appendix.  No inflammatory changes of colon.  Pelvic Genitourinary:  9 cm x 7.5 cm intrauterine heterogeneous low density lesion most compatible with a large uterine fibroid.  No free fluid.  Left ovarian cystic lesion, likely cyst.  Bones:  No aggressive osseous lesions.  Levoconvex curvature of the lumbar spine.  L4-L5 predominant degenerative  disc disease.  The pacemaker partially visualized.  Vasculature: Normal.  IMPRESSION:  1.  Constellation of findings suggesting acute cholecystitis with hydropic gallbladder, probable wall thickening and pericholecystic fluid.  Mild intrahepatic biliary ductal dilation.   No calcified cyst stones are identified in the gallbladder or common bile duct. 2.  Fibroid uterus. 3.  25 mm left ovarian cystic lesion.  Follow-up 8-week pelvic ultrasound recommended to assess for resolution.   Original Report Authenticated By: Andreas Newport, M.D.      1. Abdominal pain   2. Cholecystitis       MDM  Patient with 2 week history of right upper quadrant abdominal pain. Urine by history  has been dark suggestive of bilirubin in the urine. Patient has mild tenderness in right upper quadrant since the etiology and course will be determined by the CT scan chest x-ray. If CT scan is negative she can follow back up with her primary care Dr. no leukocytosis today mild LFT elevation bilirubins elevated 5.5. Concerns would be for gallbladder disease or liver disease. Patient we moved to CDU pending CT scan results. Patient was seen earlier today in the urgent care.   The patient was requesting to the CDU but never went to the CDU patient remained under my care. CT scan not showing signs of cholecystitis without gallstones the ultrasound is ordered to confirm that discussed with Dr. Luisa Hart from general surgery, they will consult, however patient requires internal medicine admission and GI ERCP before hand.  Ultrasound has been ordered. Patient was reexamined data done no real significant tenderness in the right upper quadrant. Patient does not have a local primary care Dr. Patient does have a history of a pacemaker.  Addendum: I discussed with GI medicine Dr. home also discussed with general surgery and discussed with the hospitalist. She will be admitted to the hospitalist team for consult from Gen. surgery and GI medicine. Temporary admit orders done.            Shelda Jakes, MD 05/28/12 1741  Shelda Jakes, MD 05/28/12 7829  Shelda Jakes, MD 05/28/12 5621  Shelda Jakes, MD 05/29/12 1406

## 2012-05-29 ENCOUNTER — Encounter (HOSPITAL_COMMUNITY): Payer: Self-pay | Admitting: Internal Medicine

## 2012-05-29 DIAGNOSIS — R17 Unspecified jaundice: Secondary | ICD-10-CM

## 2012-05-29 DIAGNOSIS — I4891 Unspecified atrial fibrillation: Secondary | ICD-10-CM

## 2012-05-29 DIAGNOSIS — Z950 Presence of cardiac pacemaker: Secondary | ICD-10-CM

## 2012-05-29 DIAGNOSIS — R7402 Elevation of levels of lactic acid dehydrogenase (LDH): Secondary | ICD-10-CM | POA: Diagnosis present

## 2012-05-29 DIAGNOSIS — K8020 Calculus of gallbladder without cholecystitis without obstruction: Secondary | ICD-10-CM

## 2012-05-29 DIAGNOSIS — E860 Dehydration: Secondary | ICD-10-CM | POA: Diagnosis present

## 2012-05-29 DIAGNOSIS — Z01810 Encounter for preprocedural cardiovascular examination: Secondary | ICD-10-CM

## 2012-05-29 DIAGNOSIS — R932 Abnormal findings on diagnostic imaging of liver and biliary tract: Secondary | ICD-10-CM

## 2012-05-29 DIAGNOSIS — Z8679 Personal history of other diseases of the circulatory system: Secondary | ICD-10-CM

## 2012-05-29 DIAGNOSIS — K8050 Calculus of bile duct without cholangitis or cholecystitis without obstruction: Secondary | ICD-10-CM | POA: Diagnosis present

## 2012-05-29 DIAGNOSIS — R109 Unspecified abdominal pain: Secondary | ICD-10-CM

## 2012-05-29 DIAGNOSIS — K812 Acute cholecystitis with chronic cholecystitis: Secondary | ICD-10-CM | POA: Diagnosis present

## 2012-05-29 DIAGNOSIS — Z7901 Long term (current) use of anticoagulants: Secondary | ICD-10-CM

## 2012-05-29 LAB — GLUCOSE, CAPILLARY: Glucose-Capillary: 118 mg/dL — ABNORMAL HIGH (ref 70–99)

## 2012-05-29 LAB — PROTIME-INR
INR: 2.87 — ABNORMAL HIGH (ref 0.00–1.49)
INR: 1.89 — ABNORMAL HIGH (ref 0.00–1.49)
Prothrombin Time: 21 seconds — ABNORMAL HIGH (ref 11.6–15.2)

## 2012-05-29 LAB — COMPREHENSIVE METABOLIC PANEL
ALT: 336 U/L — ABNORMAL HIGH (ref 0–35)
AST: 165 U/L — ABNORMAL HIGH (ref 0–37)
Albumin: 3.4 g/dL — ABNORMAL LOW (ref 3.5–5.2)
CO2: 24 mEq/L (ref 19–32)
Calcium: 9.4 mg/dL (ref 8.4–10.5)
Chloride: 102 mEq/L (ref 96–112)
Creatinine, Ser: 0.69 mg/dL (ref 0.50–1.10)
Sodium: 138 mEq/L (ref 135–145)

## 2012-05-29 LAB — CBC WITH DIFFERENTIAL/PLATELET
Basophils Relative: 1 % (ref 0–1)
Eosinophils Absolute: 1.5 10*3/uL — ABNORMAL HIGH (ref 0.0–0.7)
Eosinophils Relative: 16 % — ABNORMAL HIGH (ref 0–5)
Hemoglobin: 13.3 g/dL (ref 12.0–15.0)
Lymphs Abs: 2.1 10*3/uL (ref 0.7–4.0)
MCH: 29.5 pg (ref 26.0–34.0)
MCHC: 33.8 g/dL (ref 30.0–36.0)
MCV: 87.1 fL (ref 78.0–100.0)
Monocytes Absolute: 0.8 10*3/uL (ref 0.1–1.0)
Monocytes Relative: 8 % (ref 3–12)
RBC: 4.51 MIL/uL (ref 3.87–5.11)

## 2012-05-29 LAB — HEPARIN LEVEL (UNFRACTIONATED): Heparin Unfractionated: 0.23 IU/mL — ABNORMAL LOW (ref 0.30–0.70)

## 2012-05-29 MED ORDER — HEPARIN (PORCINE) IN NACL 100-0.45 UNIT/ML-% IJ SOLN
1200.0000 [IU]/h | INTRAMUSCULAR | Status: AC
  Administered 2012-05-29 – 2012-05-30 (×2): 1400 [IU]/h via INTRAVENOUS
  Filled 2012-05-29 (×2): qty 250

## 2012-05-29 MED ORDER — VITAMIN K1 10 MG/ML IJ SOLN
5.0000 mg | Freq: Once | INTRAVENOUS | Status: AC
  Administered 2012-05-29: 5 mg via INTRAVENOUS
  Filled 2012-05-29: qty 0.5

## 2012-05-29 MED ORDER — INFLUENZA VIRUS VACC SPLIT PF IM SUSP
0.5000 mL | INTRAMUSCULAR | Status: AC
  Administered 2012-05-30: 0.5 mL via INTRAMUSCULAR
  Filled 2012-05-29: qty 0.5

## 2012-05-29 MED ORDER — CIPROFLOXACIN IN D5W 400 MG/200ML IV SOLN
400.0000 mg | Freq: Two times a day (BID) | INTRAVENOUS | Status: DC
  Administered 2012-05-29 – 2012-06-02 (×8): 400 mg via INTRAVENOUS
  Filled 2012-05-29 (×12): qty 200

## 2012-05-29 MED ORDER — HYDROMORPHONE HCL PF 1 MG/ML IJ SOLN
0.5000 mg | INTRAMUSCULAR | Status: DC | PRN
  Administered 2012-05-30: 0.5 mg via INTRAVENOUS
  Filled 2012-05-29: qty 1

## 2012-05-29 MED ORDER — HEPARIN BOLUS VIA INFUSION
1000.0000 [IU] | Freq: Once | INTRAVENOUS | Status: AC
  Administered 2012-05-29: 1000 [IU] via INTRAVENOUS
  Filled 2012-05-29: qty 1000

## 2012-05-29 MED ORDER — SODIUM CHLORIDE 0.9 % IJ SOLN
3.0000 mL | Freq: Two times a day (BID) | INTRAMUSCULAR | Status: DC
  Administered 2012-05-29 – 2012-06-01 (×5): 3 mL via INTRAVENOUS

## 2012-05-29 MED ORDER — ONDANSETRON HCL 4 MG PO TABS: 4.0000 mg | ORAL_TABLET | Freq: Four times a day (QID) | ORAL | Status: DC | PRN

## 2012-05-29 MED ORDER — SODIUM CHLORIDE 0.9 % IV SOLN
INTRAVENOUS | Status: DC
Start: 2012-05-29 — End: 2012-06-02
  Administered 2012-05-31 (×3): via INTRAVENOUS
  Administered 2012-06-01: 1000 mL via INTRAVENOUS

## 2012-05-29 MED ORDER — HEPARIN (PORCINE) IN NACL 100-0.45 UNIT/ML-% IJ SOLN
1200.0000 [IU]/h | INTRAMUSCULAR | Status: DC
  Administered 2012-05-29: 1200 [IU]/h via INTRAVENOUS
  Filled 2012-05-29 (×2): qty 250

## 2012-05-29 MED ORDER — ACETAMINOPHEN 325 MG PO TABS: 650.0000 mg | ORAL_TABLET | Freq: Four times a day (QID) | ORAL | Status: DC | PRN

## 2012-05-29 MED ORDER — ONDANSETRON HCL 4 MG/2ML IJ SOLN
4.0000 mg | Freq: Four times a day (QID) | INTRAMUSCULAR | Status: DC | PRN
  Administered 2012-05-29: 4 mg via INTRAVENOUS
  Filled 2012-05-29: qty 2

## 2012-05-29 MED ORDER — SODIUM CHLORIDE 0.9 % IV SOLN: INTRAVENOUS | Status: DC

## 2012-05-29 MED ORDER — METOPROLOL TARTRATE 1 MG/ML IV SOLN
5.0000 mg | Freq: Four times a day (QID) | INTRAVENOUS | Status: DC
  Administered 2012-05-29 – 2012-06-01 (×12): 5 mg via INTRAVENOUS
  Filled 2012-05-29 (×19): qty 5

## 2012-05-29 MED ORDER — METRONIDAZOLE IN NACL 5-0.79 MG/ML-% IV SOLN
500.0000 mg | Freq: Three times a day (TID) | INTRAVENOUS | Status: DC
  Administered 2012-05-29 – 2012-06-02 (×12): 500 mg via INTRAVENOUS
  Filled 2012-05-29 (×16): qty 100

## 2012-05-29 MED ORDER — ACETAMINOPHEN 650 MG RE SUPP: 650.0000 mg | Freq: Four times a day (QID) | RECTAL | Status: DC | PRN

## 2012-05-29 NOTE — Consult Note (Signed)
Reason for Consult:jaundice abdominal pain Referring Physician: [name] is an 56 y.o. female.  HPI: 4 day history of RUQ and right back pain.  Constant.  Unclear if anything makes it better or worse.   No nausea.  Bilirubin  5.7.  Thick GB and 8 mm CBD by U/S.  Poor appetite. Take coumadin for a fib.  Past Medical History  Diagnosis Date  . Pacemaker   . Hypertension   . Atrial fibrillation     Past Surgical History  Procedure Date  . Pacemaker insertion   . Cesarean section     Family History  Problem Relation Age of Onset  . Cancer Mother     Social History:  reports that she has never smoked. She does not have any smokeless tobacco history on file. She reports that she does not drink alcohol or use illicit drugs.  Allergies: No Known Allergies  Medications:  I have reviewed the patient's current medications. Prior to Admission:  (Not in a hospital admission) Scheduled:   . sodium chloride   Intravenous STAT  . HYDROmorphone  1 mg Intravenous Once  . ondansetron  4 mg Intravenous Once  . sodium chloride  250 mL Intravenous Once    Results for orders placed during the hospital encounter of 05/28/12 (from the past 48 hour(s))  CBC WITH DIFFERENTIAL     Status: Abnormal   Collection Time   05/28/12  2:23 PM      Component Value Range Comment   WBC 10.5  4.0 - 10.5 K/uL    RBC 4.68  3.87 - 5.11 MIL/uL    Hemoglobin 14.0  12.0 - 15.0 g/dL    HCT 96.0  45.4 - 09.8 %    MCV 86.8  78.0 - 100.0 fL    MCH 29.9  26.0 - 34.0 pg    MCHC 34.5  30.0 - 36.0 g/dL    RDW 11.9  14.7 - 82.9 %    Platelets 437 (*) 150 - 400 K/uL    Neutrophils Relative 53  43 - 77 %    Neutro Abs 5.5  1.7 - 7.7 K/uL    Lymphocytes Relative 22  12 - 46 %    Lymphs Abs 2.3  0.7 - 4.0 K/uL    Monocytes Relative 8  3 - 12 %    Monocytes Absolute 0.9  0.1 - 1.0 K/uL    Eosinophils Relative 17 (*) 0 - 5 %    Eosinophils Absolute 1.8 (*) 0.0 - 0.7 K/uL    Basophils Relative 0  0 - 1 %     Basophils Absolute 0.0  0.0 - 0.1 K/uL   COMPREHENSIVE METABOLIC PANEL     Status: Abnormal   Collection Time   05/28/12  2:23 PM      Component Value Range Comment   Sodium 135  135 - 145 mEq/L    Potassium 3.8  3.5 - 5.1 mEq/L    Chloride 99  96 - 112 mEq/L    CO2 25  19 - 32 mEq/L    Glucose, Bld 116 (*) 70 - 99 mg/dL    BUN 9  6 - 23 mg/dL    Creatinine, Ser 5.62  0.50 - 1.10 mg/dL    Calcium 9.7  8.4 - 13.0 mg/dL    Total Protein 7.8  6.0 - 8.3 g/dL    Albumin 3.7  3.5 - 5.2 g/dL    AST 865 (*) 0 - 37 U/L  ALT 403 (*) 0 - 35 U/L    Alkaline Phosphatase 313 (*) 39 - 117 U/L    Total Bilirubin 5.5 (*) 0.3 - 1.2 mg/dL    GFR calc non Af Amer >90  >90 mL/min    GFR calc Af Amer >90  >90 mL/min   LIPASE, BLOOD     Status: Normal   Collection Time   05/28/12  2:23 PM      Component Value Range Comment   Lipase 38  11 - 59 U/L   URINALYSIS, ROUTINE W REFLEX MICROSCOPIC     Status: Abnormal   Collection Time   05/28/12  5:41 PM      Component Value Range Comment   Color, Urine ORANGE (*) YELLOW BIOCHEMICALS May BE AFFECTED BY COLOR   APPearance CLOUDY (*) CLEAR    Specific Gravity, Urine 1.014  1.005 - 1.030    pH 6.0  5.0 - 8.0    Glucose, UA NEGATIVE  NEGATIVE mg/dL    Hgb urine dipstick NEGATIVE  NEGATIVE    Bilirubin Urine LARGE (*) NEGATIVE    Ketones, ur NEGATIVE  NEGATIVE mg/dL    Protein, ur NEGATIVE  NEGATIVE mg/dL    Urobilinogen, UA 0.2  0.0 - 1.0 mg/dL    Nitrite NEGATIVE  NEGATIVE    Leukocytes, UA SMALL (*) NEGATIVE   URINE MICROSCOPIC-ADD ON     Status: Abnormal   Collection Time   05/28/12  5:41 PM      Component Value Range Comment   Squamous Epithelial / LPF MANY (*) RARE    WBC, UA 3-6  <3 WBC/hpf    Bacteria, UA RARE  RARE     Dg Chest 2 View  05/28/2012  *RADIOLOGY REPORT*  Clinical Data: 56 year old female abdominal pain, back pain, jaundice.  CHEST - 2 VIEW  Comparison: None.  Findings: Left chest cardiac AICD.  Somewhat low lung volumes.  Cardiac size and mediastinal contours are within normal limits. Visualized tracheal air column is within normal limits.  No pneumothorax, pulmonary edema or pleural effusion.  No pneumoperitoneum.  Mild scoliosis. No acute osseous abnormality identified.  IMPRESSION: Low lung volumes, otherwise no acute cardiopulmonary abnormality.   Original Report Authenticated By: Harley Hallmark, M.D.    US Abdomen Complete  05/29/2012  *RADIOLOGY REPORT*  Clinical Data:  Abdominal pain and back pain.  COMPLETE ABDOMINAL ULTRASOUND  Comparison:  CT 05/28/2012.  Findings:  Gallbladder:  Multiple gallstones are present with a large amount of biliary sludge.  Thickened wall is present although there is no sonographic Murphy's sign.  Gallbladder wall measures up to 5 mm. The largest stone measures 16 mm.  Common bile duct:  Dilated for age measuring up to 8 mm.  No common duct stone identified.  Liver:  No focal lesion identified.  Within normal limits in parenchymal echogenicity.  IVC:  Appears normal.  Pancreas:  No focal abnormality seen.  Spleen:  6.4 cm.  Normal echotexture.  Right Kidney:  10.7 cm. Normal echotexture.  Normal central sinus echo complex.  No calculi or hydronephrosis.  Left Kidney:  9.8 cm. Normal echotexture.  Normal central sinus echo complex.  No calculi or hydronephrosis.  Abdominal aorta:  No aneurysm identified.  IMPRESSION: 1.  Cholelithiasis with gallbladder wall thickening.  The absence of the sonographic Murphy's sign is atypical and suggests chronic cholecystitis however it is difficult to completely exclude acute cholecystitis. 2.  Dilation of the common bile duct without common duct stone identified.  If cholecystectomy performed, intraoperative cholangiogram recommended.   Original Report Authenticated By: Andreas Newport, M.D.    Ct Abdomen Pelvis W Contrast  05/28/2012  *RADIOLOGY REPORT*  Clinical Data: Epigastric pain.  Pain radiating to the back.  Poor appetite.  Nausea.  No vomiting.  CT  ABDOMEN AND PELVIS WITH CONTRAST  Technique:  Multidetector CT imaging of the abdomen and pelvis was performed following the standard protocol during bolus administration of intravenous contrast.  Contrast:  100 ml Omnipaque-300.  Comparison: None.  Findings: Lung Bases: Dependent atelectasis.  No airspace disease.        Liver:  Mild intrahepatic biliary ductal dilation.  Correlation with liver function studies and bilirubin recommended.  No mass lesion.  Portal vein appears normal.  Spleen:  Normal.  Gallbladder:  Hydropic with probable wall thickening and pericholecystic stranding.   Small amount of pericholecystic fluid suggest acute cholecystitis.  Common bile duct:  No common duct stone identified. Normal diameter.  Pancreas:  Normal.  Adrenal glands:  Normal.  Kidneys:  Normal enhancement and excretion.  Stomach:  Small hiatal hernia.  No inflammatory changes.  Small bowel:  No mesenteric adenopathy.  No obstruction.  Colon:   Normal appendix.  No inflammatory changes of colon.  Pelvic Genitourinary:  9 cm x 7.5 cm intrauterine heterogeneous low density lesion most compatible with a large uterine fibroid.  No free fluid.  Left ovarian cystic lesion, likely cyst.  Bones:  No aggressive osseous lesions.  Levoconvex curvature of the lumbar spine.  L4-L5 predominant degenerative disc disease.  The pacemaker partially visualized.  Vasculature: Normal.  IMPRESSION:  1.  Constellation of findings suggesting acute cholecystitis with hydropic gallbladder, probable wall thickening and pericholecystic fluid.  Mild intrahepatic biliary ductal dilation.   No calcified cyst stones are identified in the gallbladder or common bile duct. 2.  Fibroid uterus. 3.  25 mm left ovarian cystic lesion.  Follow-up 8-week pelvic ultrasound recommended to assess for resolution.   Original Report Authenticated By: Andreas Newport, M.D.     Review of Systems  Constitutional: Negative for fever and chills.  HENT: Negative.   Eyes:  Negative.   Respiratory: Negative.   Cardiovascular: Negative.   Gastrointestinal: Positive for heartburn.  Genitourinary: Negative.   Musculoskeletal: Negative.   Skin: Negative.   Neurological: Negative.   Endo/Heme/Allergies: Negative.   Psychiatric/Behavioral: Negative.    Blood pressure 129/79, pulse 66, temperature 97.8 F (36.6 C), temperature source Oral, resp. rate 18, height 5\' 6"  (1.676 m), weight 187 lb (84.823 kg), SpO2 99.00%. Physical Exam  Constitutional: She is oriented to person, place, and time. She appears well-developed and well-nourished.  HENT:  Head: Normocephalic and atraumatic.  Eyes: Scleral icterus is present.  Neck: Normal range of motion.  Cardiovascular: An irregular rhythm present.  GI: She exhibits no distension.    Neurological: She is alert and oriented to person, place, and time.  Skin:       jaundice  Psychiatric: She has a normal mood and affect. Her behavior is normal. Judgment and thought content normal.    Assessment/Plan: Cholelithiasis Question acute cholecystitis on chronic cholecystitis Jaundice Anticoagulated    Needs GI input  For possible ERCP once anticoagulation reversed. Needs cardiac clearance for eventual lap chole or perc drain NPO  ABX Will follow  Charrie Mcconnon A. 05/29/2012, 12:28 AM

## 2012-05-29 NOTE — Progress Notes (Signed)
ANTICOAGULATION CONSULT NOTE - Initial Consult  Pharmacy Consult for Heparin Indication: h/o Afib  No Known Allergies  Patient Measurements: Height: 5\' 6"  (167.6 cm) Weight: 187 lb (84.823 kg) IBW/kg (Calculated) : 59.3  Heparin Dosing Weight: 80 kg   Vital Signs: Temp: 97.8 F (36.6 C) (09/23 0253) Temp src: Oral (09/23 0123) BP: 152/94 mmHg (09/23 0253) Pulse Rate: 67  (09/23 0057)  Labs:  Basename 05/29/12 0054 05/28/12 1423  HGB -- 14.0  HCT -- 40.6  PLT -- 437*  APTT -- --  LABPROT 28.6* --  INR 2.87* --  HEPARINUNFRC -- --  CREATININE -- 0.73  CKTOTAL -- --  CKMB -- --  TROPONINI -- --    Estimated Creatinine Clearance: 86.2 ml/min (by C-G formula based on Cr of 0.73).   Medical History: Past Medical History  Diagnosis Date  . Pacemaker   . Hypertension   . Atrial fibrillation     Medications:  Prescriptions prior to admission  Medication Sig Dispense Refill  . carvedilol (COREG) 12.5 MG tablet Take 12.5 mg by mouth 2 (two) times daily with a meal.      . enalapril (VASOTEC) 2.5 MG tablet Take 3.75 mg by mouth 2 (two) times daily.       Marland Kitchen omeprazole (PRILOSEC) 20 MG capsule Take 1 capsule (20 mg total) by mouth daily.  30 capsule  0  . potassium chloride (K-DUR) 10 MEQ tablet Take 10 mEq by mouth daily.       Marland Kitchen warfarin (COUMADIN) 5 MG tablet Take 2.5-5 mg by mouth every evening. Take 5mg  (one tablet) on all days EXCEPT on Thursday and Sunday. On Thursday and Sunday, take 2.5mg  (one-half tablet).        Assessment: 56 yo female admitted with cholecystitis, awaiting possible ERCP, h/o Afib for anticoagulation.  Vitamin K 5 mg IV given at 0300 in ED, heparin to start for bridge therapy.   Goal of Therapy:  Heparin level 0.3-0.7 units/ml Monitor platelets by anticoagulation protocol: Yes   Plan:  Start Heparin 1300 units/hr at 0600. Check heparin level in 8 hours.  Beth May 05/29/2012,3:42 AM

## 2012-05-29 NOTE — Progress Notes (Signed)
*  PRELIMINARY RESULTS* Echocardiogram 2D Echocardiogram has been performed.  Beth May 05/29/2012, 10:02 AM

## 2012-05-29 NOTE — Care Management Note (Addendum)
    Page 1 of 1   06/02/2012     12:57:59 PM   CARE MANAGEMENT NOTE 06/02/2012  Patient:  Beth May,Beth May   Account Number:  [id number]  Date Initiated:  05/29/2012  Documentation initiated by:  Junius Creamer  Subjective/Objective Assessment:   adm w abd pain, cholilithiasis     Action/Plan:   lives alone   Anticipated DC Date:  06/02/2012   Anticipated DC Plan:  HOME/SELF CARE      DC Planning Services  CM consult      Choice offered to / List presented to:             Status of service:  Completed, signed off Medicare Important Message given?   (If response is "NO", the following Medicare IM given date fields will be blank) Date Medicare IM given:   Date Additional Medicare IM given:    Discharge Disposition:  HOME/SELF CARE  Per UR Regulation:  Reviewed for med. necessity/level of care/duration of stay  If discussed at Long Length of Stay Meetings, dates discussed:    Comments:  06/02/12 12:57 Letha Cape RN, BSN [phone] patient dc to home today, no needs identified.  9/23 9:38a debbie dowell rn,bsn [phone]

## 2012-05-29 NOTE — Consult Note (Signed)
[facility] Consult 10:39 AM 05/29/2012   Referring Provider: Dr Toniann Fail  Primary Care Physician:  [facility], [facility] Primary Gastroenterologist:  Dr. Maura Crandall at [facility] clinic Cardiologist in Dr Rosary Lively  [phone].   Reason for Consultation:  Jaundice  HPI: Beth May is a 56 y.o. female.  She is originally from [place].  Her residence is in [place], [place] but she visits a close friend, who lives in [place] frequently.  S/P Pakemaker defibrillator in 2009, but denies hx of MI or CAD.  On coumadin for hx A Fib. Had EGD in August or late July 2013 for c/o heartburn, it showed stomach irritation.  She treats occasional sxs with either Zantac or Omeprazole.  The former works better.  05/10/12 in ED with 2 days  n/v, upper abd pain, loose stools that began post restaurant meal of fish.  She had diffuse tenderness on abdominal exam. I Stat labs were done, showed Hgb of 16. Glucose 122.  No LFTs were included. Acute gastroenteritis, possibly food born illness, was treated with loperamide and Zofran.  For the next 10 or so days has had malaise, anorexia, not significant abd pain.  Lost about 15 #. Yesterday 9/22 developed acute severe pain in RUQ radiating to back, n/v.   LFTs noteable for jaundice, t bili 5.5 today 5.8.  Transaminases in 190s and 400, have declined today.  INR is 1.8 today. Ultrasound and CT suggest chronic Cholecystitis with wall thickening, pericholecystic fluid, lots of sludge and large stones. CBD 8mm by U/S, normal by CT, no CBD stones seen. Incidental left ovarian cyst noted.  Started on Cipro and Flagyl.  Seen by Dr Luisa Hart. He wants GI to consider ERCP pre-op Nausea this AM, better now.  Adominal pain also improved this AM.  Just had an Echo of her heart.  No SOB or cough  ROS Dark color urine yesterday. No rash or itching No swelling in LE minor , non-exertional cp yest No  sweats or chills No syncope.  No seizure  Rare glass of wine, no other etoh No cigarettes No NSAIDs Wears bifocals, no vision issues. No dental issues.  No issues with anemia  No prior transfusions No hx liver or GB problems Otherwise unremarkable 12 system review.     Past Medical History  Diagnosis Date  . ICD (implantable cardiac defibrillator) in place 2009  . Hypertension   . Atrial fibrillation 2009  . Pacemaker   . Bell's palsy 2008    Past Surgical History  Procedure Date  . Pacemaker insertion 2009    combo pacer, ICD  . Tubal ligation     Prior to Admission medications   Medication Sig Start Date End Date Taking? Authorizing Provider  carvedilol (COREG) 12.5 MG tablet Take 12.5 mg by mouth 2 (two) times daily with a meal.   Yes Historical Provider, MD  enalapril (VASOTEC) 2.5 MG tablet Take 3.75 mg by mouth 2 (two) times daily.    Yes Historical Provider, MD  omeprazole (PRILOSEC) 20 MG capsule Take 1 capsule (20 mg total) by mouth daily. 05/10/12 05/10/13 Yes Adlih Moreno-Coll, MD  potassium chloride (K-DUR) 10 MEQ tablet Take 10 mEq by mouth daily.    Yes Historical Provider, MD  warfarin (COUMADIN) 5 MG tablet Take 2.5-5 mg by mouth every evening. Take 5mg  (one tablet) on all days EXCEPT on Thursday and Sunday. On Thursday and Sunday, take 2.5mg  (one-half tablet).   Yes Historical Provider, MD  Scheduled Meds:   . ciprofloxacin  400 mg Intravenous BID  . influenza  inactive virus vaccine  0.5 mL Intramuscular Tomorrow-1000  . metoprolol  5 mg Intravenous QID  . metronidazole  500 mg Intravenous Q8H  . phytonadione (VITAMIN K) IV  5 mg Intravenous Once  . sodium chloride  250 mL Intravenous Once  . sodium chloride  3 mL Intravenous Q12H   Infusions:    . sodium chloride 1,000 mL (05/29/12 0345)  . heparin 1,200 Units/hr (05/29/12 0631)   PRN Meds: acetaminophen, acetaminophen, HYDROmorphone (DILAUDID) injection, ondansetron (ZOFRAN) IV,  ondansetron   Allergies as of 05/28/2012  . (No Known Allergies)    Family History  Problem Relation Age of Onset  . Cancer Mother     History   Social History  . Marital Status: Divorced    Spouse Name: N/A    Number of Children: N/A  . Years of Education: N/A   Occupational History  . Retired Associate Professor, Producer, television/film/video   Social History Main Topics  . Smoking status: Never Smoker   . Smokeless tobacco: Not on file  . Alcohol Use: Rare glass of wine.   . Drug Use: No  . Sexually Active: Yes    Birth Control/ Protection: Post-menopausal    PHYSICAL EXAM: Vital signs in last 24 hours: Temp:  [97.6 F (36.4 C)-98.6 F (37 C)] 98.6 F (37 C) (09/23 0700) Pulse Rate:  [59-76] 61  (09/23 0700) Resp:  [14-23] 14  (09/23 0700) BP: (115-152)/(61-99) 116/61 mmHg (09/23 0700) SpO2:  [96 %-100 %] 97 % (09/23 0700) Weight:  [182 lb 15.7 oz (83 kg)-187 lb (84.823 kg)] 182 lb 15.7 oz (83 kg) (09/23 0330)  General: Looks well Head:  No asymmetry or swelling in face  Eyes:  Slight icterus Ears:  Not HOH  Nose:  No discharge or congestion Mouth:  Good dental repair, clear and pink MM Neck:  No mass or JVD Lungs:  Clear.  No SOB, cough.  Heart: RRR.  No MRG.  Pacemaker/ICD in upper left chest.  Abdomen:  Soft, no mass, active BS, no HSM.  Mild to minimal tenderness in RUQ/epigastrum.  Active BS.  No bruits.   Rectal: not done   Musc/Skeltl: no joint swelling or redness Extremities:  No pedal edema.  Feet warm, cap refill brisk  Neurologic:  Not confused, not tremor.  Fully oriented.  No gross deficits.  Sits up in med without assistance Skin:  Slight jaundice.  Tattoos:  none   Psych:  Pleasant, cooperative.  Good historian.   Intake/Output from previous day: 09/22 0701 - 09/23 0700 In: 612 [I.V.:312; IV Piggyback:300] Out: 1075 [Urine:1075] Intake/Output this shift: Total I/O In: 224 [I.V.:224] Out: -   LAB RESULTS:  Basename 05/29/12 0534 05/28/12 1423  WBC  9.5 10.5  HGB 13.3 14.0  HCT 39.3 40.6  PLT 399 437*   BMET Lab Results  Component Value Date   NA 138 05/29/2012   NA 135 05/28/2012   NA 138 05/10/2012   K 3.7 05/29/2012   K 3.8 05/28/2012   K 5.1 05/10/2012   CL 102 05/29/2012   CL 99 05/28/2012   CL 104 05/10/2012   CO2 24 05/29/2012   CO2 25 05/28/2012   GLUCOSE 88 05/29/2012   GLUCOSE 116* 05/28/2012   GLUCOSE 122* 05/10/2012   BUN 8 05/29/2012   BUN 9 05/28/2012   BUN 19 05/10/2012   CREATININE 0.69 05/29/2012   CREATININE 0.73 05/28/2012  CREATININE 1.10 05/10/2012   CALCIUM 9.4 05/29/2012   CALCIUM 9.7 05/28/2012   LFT  Basename 05/29/12 0534 05/28/12 1423  PROT 7.0 7.8  ALBUMIN 3.4* 3.7  AST 165* 195*  ALT 336* 403*  ALKPHOS 286* 313*  BILITOT 5.8* 5.5*  BILIDIR -- --  IBILI -- --   Lipase                               38  PT/INR Lab Results  Component Value Date   INR 1.89* 05/29/2012  0534   INR 2.87* 05/29/2012  0054         RADIOLOGY STUDIES: Studies/Results: 05/28/2012   CHEST - 2 VIEW  .  IMPRESSION: Low lung volumes, otherwise no acute cardiopulmonary abnormality.   Original Report Authenticated By: Harley Hallmark, M.D.    05/29/2012  COMPLETE ABDOMINAL ULTRASOUND    IMPRESSION: 1.  Cholelithiasis with gallbladder wall thickening.  The absence of the sonographic Murphy's sign is atypical and suggests chronic cholecystitis however it is difficult to completely exclude acute cholecystitis. 2.  Dilation of the common bile duct without common duct stone identified.  If cholecystectomy performed, intraoperative cholangiogram recommended.   Original Report Authenticated By: Andreas Newport, M.D.    05/28/2012.  CT ABDOMEN AND PELVIS WITH CONTRAST    IMPRESSION:  1.  Constellation of findings suggesting acute cholecystitis with hydropic gallbladder, probable wall thickening and pericholecystic fluid.  Mild intrahepatic biliary ductal dilation.   No calcified cyst stones are identified in the gallbladder or common bile duct. 2.   Fibroid uterus. 3.  25 mm left ovarian cystic lesion.  Follow-up 8-week pelvic ultrasound recommended to assess for resolution.   Original Repted By: Andreas Newport, M.D.   ENDOSCOPIC STUDIES: * EGD July or August 2013 in [place]:  Gastric irritation. *  Colonoscopy in 2012.  Unremarkable.  Told she did not need to return for 10 years.   IMPRESSION: *  Symptomatic cholelithiasis, chronic cholecystitis. Pt not toxic.  *  Elevated LFTs and 8 mm CBD on ultrasound. R/O Choledocholithiasis. LFTs improved.  *  Chronic coumadin.  INR in decline. Got single dose of IV Vit K.  On Heparin drip. *  S/P ICD/pacemaker *  Hyperglycemia.   PLAN: *  ERCP if INR < 2.0 and cleared by Cardiology to proceed. *  Pt is NPO but would need to stop the Heparin before any procedures.  *  Awaiting results of Echo and of cardiologist operative clearance.    LOS: 1 day   Jennye Moccasin  05/29/2012, 10:39 AM Pager: [phone]    I have taken a history, examined the patient and reviewed the chart. I agree with the extender's note, impression and recommendations. Suspected choledocholithiasis. ERCP if INR < 2.0 and cleared to proceed by Cardiology.  Meryl Dare MD [facility]

## 2012-05-29 NOTE — Consult Note (Signed)
CARDIOLOGY CONSULT NOTE  Patient ID: Beth May, MRN: [id number], DOB/AGE: 09/28/1955 56 y.o. Admit date: 05/28/2012 Date of Consult: 05/29/2012  Primary Physician: [facility], [facility] Primary Cardiologist: Dr Eustace Moore in [place]  Chief Complaint: RUQ abd pain Reason for Consultation: cardiac preop clearance  HPI: 56 y.o. female w/ PMHx significant for HTN, Atrial Fibrillation (on coumadin), and [employer] BiV AICD 2009 who presented to [facility] on 05/28/2012 with complaints of back and RUQ abdominal pain and found to have cholecystitis.  Beth May is originally from the [place] and currently resides in [place], but is visiting a friend in [place]. Beth May reports having a history of atrial fibrillation and placement of [employer] BiV ICD in 2009, but unsure if Beth May has history of cardiomyopathy. Beth May noted having a cardiac cath at that time that was normal. Beth May is very active and able to exercise in the gym 4 days a week without chest pain or sob. Denies chest pain, sob, palpitations, orthopnea, edema, or syncope. Had n/v, fever, chills, RUQ abd pain as well as back pain for ~3 weeks that was initially felt to be r/t food poisoning, but persisted prompting her to see medical evaluation.  In the ED, EKG showed V-paced rhythm 69bpm. CXR was without acute cardiopulmonary findings. Abd [place] and CT suggest chronic cholecystitis with wall thickening, pericholecystic fluid, lots of sludge and large stones. CBD 8mm by U/S, normal by CT, no CBD stones seen. Incidental left ovarian cyst noted. Labs are significant for normal troponin x2, INR 2.87, WBC 10.5, AST/ALT 195/403, Alk Phos 313, Total Bili 5.5, Lipase 38, and UA nitrite neg. Beth May was noted to be jaundiced and dehydrated with heart rates 90-130s. Beth May was placed on abx, IVF, admitted by the medicine service, and consulted by GI and general surgery. Plans are for ERCP and eventual lap  cholecystectomy. Coumadin has been held and reversed with Vit K. Coreg and enalapril were stopped and Beth May was placed on lopressor 5mg  IV Q6H. Echo is pending. Cardiology is asked to provide cardiac preop clearance.   Past Medical History  Diagnosis Date  . ICD (implantable cardiac defibrillator) in place 2009    [employer]  . Hypertension   . Atrial fibrillation 2009  . Pacemaker   . Bell's palsy 2008      Surgical History:  Past Surgical History  Procedure Date  . Pacemaker insertion 2009    combo pacer, ICD  . Tubal ligation      Home Meds: Medication Sig  carvedilol (COREG) 12.5 MG tablet Take 12.5 mg by mouth 2 (two) times daily with a meal.  enalapril (VASOTEC) 2.5 MG tablet Take 3.75 mg by mouth 2 (two) times daily.   omeprazole (PRILOSEC) 20 MG capsule Take 1 capsule (20 mg total) by mouth daily.  potassium chloride (K-DUR) 10 MEQ tablet Take 10 mEq by mouth daily.   warfarin (COUMADIN) 5 MG tablet Take 2.5-5 mg by mouth every evening. Take 5mg  (one tablet) on all days EXCEPT on Thursday and Sunday. On Thursday and Sunday, take 2.5mg  (one-half tablet).    Inpatient Medications:   . ciprofloxacin  400 mg Intravenous BID  . influenza  inactive virus vaccine  0.5 mL Intramuscular Tomorrow-1000  . metoprolol  5 mg Intravenous QID  . metronidazole  500 mg Intravenous Q8H  . phytonadione (VITAMIN K) IV  5 mg Intravenous Once  . sodium chloride  250 mL Intravenous Once  . sodium chloride  3 mL Intravenous Q12H  . DISCONTD: sodium chloride   Intravenous STAT  . DISCONTD: HYDROmorphone  1 mg Intravenous Once  . DISCONTD: ondansetron  4 mg Intravenous Once   . sodium chloride 1,000 mL (05/29/12 0345)  . heparin 1,200 Units/hr (05/29/12 0631)    Allergies: No Known Allergies  History   Social History  . Marital Status: Divorced    Spouse Name: N/A    Number of Children: N/A  . Years of Education: N/A   Occupational History  . Not on file.   Social History  Main Topics  . Smoking status: Never Smoker   . Smokeless tobacco: Not on file  . Alcohol Use: No  . Drug Use: No  . Sexually Active: Yes    Birth Control/ Protection: Post-menopausal   Other Topics Concern  . Not on file   Social History Narrative  . No narrative on file     Family History  Problem Relation Age of Onset  . Cancer Mother      Review of Systems: General: (+) chills, fever, 15lb weight loss in 28mo; No night sweats  Cardiovascular: negative for chest pain, shortness of breath, dyspnea on exertion, edema, orthopnea, palpitations, or paroxysmal nocturnal dyspnea Dermatological: negative for rash Respiratory: negative for cough or wheezing Urologic: negative for hematuria Abdominal: (+)  nausea, vomiting, abd pain; negative for diarrhea, bright red blood per rectum, melena, or hematemesis Neurologic: negative for visual changes, syncope, or dizziness All other systems reviewed and are otherwise negative except as noted above.  Labs:  [facility] 05/29/12 1012 05/29/12 0534  TROPONINI <0.30 <0.30   Component Value Date   WBC 9.5 05/29/2012   HGB 13.3 05/29/2012   HCT 39.3 05/29/2012   MCV 87.1 05/29/2012   PLT 399 05/29/2012      05/29/2012 05:34  Prothrombin Time 21.0 (H)  INR 1.89 (H)   Lab 05/29/12 0534  NA 138  K 3.7  CL 102  CO2 24  BUN 8  CREATININE 0.69  CALCIUM 9.4  PROT 7.0  BILITOT 5.8*  ALKPHOS 286*  ALT 336*  AST 165*  GLUCOSE 88   Radiology/Studies:   05/28/2012 - Chest 2 View Findings: Left chest cardiac AICD.  Somewhat low lung volumes. Cardiac size and mediastinal contours are within normal limits. Visualized tracheal air column is within normal limits.  No pneumothorax, pulmonary edema or pleural effusion.  No pneumoperitoneum.  Mild scoliosis. No acute osseous abnormality identified.  IMPRESSION: Low lung volumes, otherwise no acute cardiopulmonary abnormality.     05/29/2012 - US Abdomen Complete Findings:  Gallbladder:  Multiple  gallstones are present with a large amount of biliary sludge.  Thickened wall is present although there is no sonographic Murphy's sign.  Gallbladder wall measures up to 5 mm. The largest stone measures 16 mm.  Common bile duct:  Dilated for age measuring up to 8 mm.  No common duct stone identified.  Liver:  No focal lesion identified.  Within normal limits in parenchymal echogenicity.  IVC:  Appears normal.  Pancreas:  No focal abnormality seen.  Spleen:  6.4 cm.  Normal echotexture.  Right Kidney:  10.7 cm. Normal echotexture.  Normal central sinus echo complex.  No calculi or hydronephrosis.  Left Kidney:  9.8 cm. Normal echotexture.  Normal central sinus echo complex.  No calculi or hydronephrosis.  Abdominal aorta:  No aneurysm identified.  IMPRESSION: 1.  Cholelithiasis with gallbladder wall thickening.  The absence of the sonographic Murphy's sign is atypical  and suggests chronic cholecystitis however it is difficult to completely exclude acute cholecystitis. 2.  Dilation of the common bile duct without common duct stone identified.  If cholecystectomy performed, intraoperative cholangiogram recommended.   05/28/2012 - Ct Abdomen Pelvis W Contrast Findings: Lung Bases: Dependent atelectasis.  No airspace disease.  Liver:  Mild intrahepatic biliary ductal dilation.  Correlation with liver function studies and bilirubin recommended.  No mass lesion.  Portal vein appears normal.  Spleen:  Normal.  Gallbladder:  Hydropic with probable wall thickening and pericholecystic stranding.   Small amount of pericholecystic fluid suggest acute cholecystitis.  Common bile duct:  No common duct stone identified. Normal diameter.  Pancreas:  Normal.  Adrenal glands:  Normal.  Kidneys:  Normal enhancement and excretion.  Stomach:  Small hiatal hernia.  No inflammatory changes.  Small bowel:  No mesenteric adenopathy.  No obstruction.  Colon:   Normal appendix.  No inflammatory changes of colon.  Pelvic Genitourinary:  9 cm  x 7.5 cm intrauterine heterogeneous low density lesion most compatible with a large uterine fibroid.  No free fluid.  Left ovarian cystic lesion, likely cyst.  Bones:  No aggressive osseous lesions.  Levoconvex curvature of the lumbar spine.  L4-L5 predominant degenerative disc disease.  The pacemaker partially visualized.  Vasculature: Normal.  IMPRESSION:  1.  Constellation of findings suggesting acute cholecystitis with hydropic gallbladder, probable wall thickening and pericholecystic fluid.  Mild intrahepatic biliary ductal dilation.   No calcified cyst stones are identified in the gallbladder or common bile duct. 2.  Fibroid uterus. 3.  25 mm left ovarian cystic lesion.  Follow-up 8-week pelvic ultrasound recommended to assess for resolution.      EKG: 05/28/12 @ 1419 - V-paced rhythm 69bpm  Physical Exam: Blood pressure 148/70, pulse 61, temperature 98.3 F (36.8 C), temperature source Oral, resp. rate 15, height 5\' 6"  (1.676 m), weight 182 lb 15.7 oz (83 kg), SpO2 97.00%. General: Well developed, well nourished, hispanic female in no acute distress. Head: Normocephalic, atraumatic, slight icterus, no xanthomas, nares are without discharge.  Neck: Supple. Negative for carotid bruits. JVD not elevated. Lungs: Clear bilaterally to auscultation without wheezes, rales, or rhonchi. Breathing is unlabored. Heart: RRR with S1 S2. 2/6 systolic murmur RUSB. No rubs, or gallops appreciated. Abdomen: Soft, mild RUQ tenderness. Non-distended with normoactive bowel sounds. No rebound/guarding. No obvious abdominal masses. Msk:  Strength and tone appear normal for age. Extremities: No clubbing or cyanosis. No edema.  Distal pedal pulses are intact and equal bilaterally. Skin: Slight jaundice Neuro: Alert and oriented X 3. Moves all extremities spontaneously. Psych:  Responds to questions appropriately with a normal affect.   Assessment and Plan:  56 y.o. female w/ PMHx significant for HTN, Atrial  Fibrillation (on coumadin), and [employer] BiV AICD 2009 who presented to [facility] on 05/28/2012 with complaints of back and RUQ abdominal pain and found to have cholecystitis.  1. Acute on Chronic Cholecystitis 2. Transaminitis 3. Atrial Fibrillation - on chronic coumadin 4. [employer] BiV AICD 5. Hypertension 6. Left Ovarian Cyst  Patient presents with RUQ abd pain with imaging consistent with cholecystitis.  Beth May is stable with improving LFTs. Plans are for ERCP with eventual lap cholecystectomy. Management per primary team. Appears to be stable from a cardiac perspective. Beth May reports being very active without anginal symptoms and reportedly had a normal cardiac cath 34yrs ago. Has h/o a.fib on chronic coumadin and BiV AICD placement in 2009. Will obtain records from primary  cardiologist and interrogate ICD. Echo is pending. Cardiac enzymes normal. Further recommendations after echo and records reviewed.  Signed, HOPE, JESSICA PA-C 05/29/2012, 1:50 PM   Patient examined chart reviewed.  No history of CAD  ? DCM with CRT-D  No angina and functional class one.  Will have [employer] interogate device. Echo pending.  Should be fine for cholycystectomy if deemed necessary at this time.   Charlton Haws 2:22 PM 05/29/2012

## 2012-05-29 NOTE — Progress Notes (Signed)
Subjective: Sitting up in chair, still with some mild back pain and RUQ pain; but this is improved since admission. No N/V. +BS  Objective: Vital signs in last 24 hours: Temp:  [97.6 F (36.4 C)-98.6 F (37 C)] 98.6 F (37 C) (09/23 0700) Pulse Rate:  [59-76] 61  (09/23 0700) Resp:  [14-23] 14  (09/23 0700) BP: (115-152)/(61-99) 116/61 mmHg (09/23 0700) SpO2:  [96 %-100 %] 97 % (09/23 0700) Weight:  [182 lb 15.7 oz (83 kg)-187 lb (84.823 kg)] 182 lb 15.7 oz (83 kg) (09/23 0330)    Intake/Output from previous day: 09/22 0701 - 09/23 0700 In: 500 [I.V.:200; IV Piggyback:300] Out: 1075 [Urine:1075] Intake/Output this shift:    General appearance: alert, cooperative, appears stated age and no distress; jaundice. Chest: CTA bilaterally Cardiac: RRR Abdomen: soft, slight tenderness in RUQ with deep palpation, +BS. Extremities: warm, + pulses, no edema. Sclera Jaunduice VSS, HD stable afebrile, HR 50's on BB. WBC wnl, H&H stable, LFT elevated AST 165 Alt 336 Tbili 5.8   Lab Results:   Basename 05/29/12 0534 05/28/12 1423  WBC 9.5 10.5  HGB 13.3 14.0  HCT 39.3 40.6  PLT 399 437*   BMET  Basename 05/29/12 0534 05/28/12 1423  NA 138 135  K 3.7 3.8  CL 102 99  CO2 24 25  GLUCOSE 88 116*  BUN 8 9  CREATININE 0.69 0.73  CALCIUM 9.4 9.7   PT/INR  Basename 05/29/12 0534 05/29/12 0054  LABPROT 21.0* 28.6*  INR 1.89* 2.87*   ABG No results found for this basename: PHART:2,PCO2:2,PO2:2,HCO3:2 in the last 72 hours  Studies/Results: Dg Chest 2 View  05/28/2012  *RADIOLOGY REPORT*  Clinical Data: 56 year old female abdominal pain, back pain, jaundice.  CHEST - 2 VIEW  Comparison: None.  Findings: Left chest cardiac AICD.  Somewhat low lung volumes. Cardiac size and mediastinal contours are within normal limits. Visualized tracheal air column is within normal limits.  No pneumothorax, pulmonary edema or pleural effusion.  No pneumoperitoneum.  Mild scoliosis. No acute  osseous abnormality identified.  IMPRESSION: Low lung volumes, otherwise no acute cardiopulmonary abnormality.   Original Report Authenticated By: Harley Hallmark, M.D.    US Abdomen Complete  05/29/2012  *RADIOLOGY REPORT*  Clinical Data:  Abdominal pain and back pain.  COMPLETE ABDOMINAL ULTRASOUND  Comparison:  CT 05/28/2012.  Findings:  Gallbladder:  Multiple gallstones are present with a large amount of biliary sludge.  Thickened wall is present although there is no sonographic Murphy's sign.  Gallbladder wall measures up to 5 mm. The largest stone measures 16 mm.  Common bile duct:  Dilated for age measuring up to 8 mm.  No common duct stone identified.  Liver:  No focal lesion identified.  Within normal limits in parenchymal echogenicity.  IVC:  Appears normal.  Pancreas:  No focal abnormality seen.  Spleen:  6.4 cm.  Normal echotexture.  Right Kidney:  10.7 cm. Normal echotexture.  Normal central sinus echo complex.  No calculi or hydronephrosis.  Left Kidney:  9.8 cm. Normal echotexture.  Normal central sinus echo complex.  No calculi or hydronephrosis.  Abdominal aorta:  No aneurysm identified.  IMPRESSION: 1.  Cholelithiasis with gallbladder wall thickening.  The absence of the sonographic Murphy's sign is atypical and suggests chronic cholecystitis however it is difficult to completely exclude acute cholecystitis. 2.  Dilation of the common bile duct without common duct stone identified.  If cholecystectomy performed, intraoperative cholangiogram recommended.   Original Report Authenticated By: Juliene Pina  Carlota Raspberry, M.D.    Ct Abdomen Pelvis W Contrast  05/28/2012  *RADIOLOGY REPORT*  Clinical Data: Epigastric pain.  Pain radiating to the back.  Poor appetite.  Nausea.  No vomiting.  CT ABDOMEN AND PELVIS WITH CONTRAST  Technique:  Multidetector CT imaging of the abdomen and pelvis was performed following the standard protocol during bolus administration of intravenous contrast.  Contrast:  100 ml  Omnipaque-300.  Comparison: None.  Findings: Lung Bases: Dependent atelectasis.  No airspace disease.        Liver:  Mild intrahepatic biliary ductal dilation.  Correlation with liver function studies and bilirubin recommended.  No mass lesion.  Portal vein appears normal.  Spleen:  Normal.  Gallbladder:  Hydropic with probable wall thickening and pericholecystic stranding.   Small amount of pericholecystic fluid suggest acute cholecystitis.  Common bile duct:  No common duct stone identified. Normal diameter.  Pancreas:  Normal.  Adrenal glands:  Normal.  Kidneys:  Normal enhancement and excretion.  Stomach:  Small hiatal hernia.  No inflammatory changes.  Small bowel:  No mesenteric adenopathy.  No obstruction.  Colon:   Normal appendix.  No inflammatory changes of colon.  Pelvic Genitourinary:  9 cm x 7.5 cm intrauterine heterogeneous low density lesion most compatible with a large uterine fibroid.  No free fluid.  Left ovarian cystic lesion, likely cyst.  Bones:  No aggressive osseous lesions.  Levoconvex curvature of the lumbar spine.  L4-L5 predominant degenerative disc disease.  The pacemaker partially visualized.  Vasculature: Normal.  IMPRESSION:  1.  Constellation of findings suggesting acute cholecystitis with hydropic gallbladder, probable wall thickening and pericholecystic fluid.  Mild intrahepatic biliary ductal dilation.   No calcified cyst stones are identified in the gallbladder or common bile duct. 2.  Fibroid uterus. 3.  25 mm left ovarian cystic lesion.  Follow-up 8-week pelvic ultrasound recommended to assess for resolution.   Original Report Authenticated By: Andreas Newport, M.D.     Anti-infectives: Anti-infectives     Start     Dose/Rate Route Frequency Ordered Stop   05/29/12 0400   metroNIDAZOLE (FLAGYL) IVPB 500 mg        500 mg 100 mL/hr over 60 Minutes Intravenous 3 times per day 05/29/12 0337     05/29/12 0400   ciprofloxacin (CIPRO) IVPB 400 mg        400 mg 200 mL/hr  over 60 Minutes Intravenous 2 times daily 05/29/12 0341            Assessment/Plan: s/p * No surgery found *  Will await GI and Cardiology's input  (possible ERCP today for CBD stone) Will need Lap chole at some time in the future once cleared by cardiology, for chronic cholecystitis with intraoperative  cholangiogram. Internal medicines note is seen and there assistance is appreciated. We will continue to follow.    LOS: 1 day    Heily Carlucci 05/29/2012

## 2012-05-29 NOTE — H&P (Signed)
Beth May is an 56 y.o. female.   Patient was seen and examined on May 29, 2012. Patient is visiting from [place]. I have used a Engineer, structural to obtain history and physical. Chief Complaint: Abdominal pain. HPI: 56 year old female with history of atrial fibrillation, tachybradycardia syndrome status post pacemaker placement on Coumadin presents with complaints of right upper quadrant pain and back pain. Patient has been having these symptoms for last 3 weeks. Patient initially thought it was food poisoning. But since the symptoms has been persistent and worsening patient had come to the ER. In the ER CT abdomen and pelvis shows features of cholecystitis and also has lab showing jaundice. Surgery and gastroenterologist has been consulted. Sonogram of the abdomen was done which at this time shows features of cholecystitis possible chronic. Patient's heart rate in the ER was fluctuating between 90 and 130.  Past Medical History  Diagnosis Date  . Pacemaker   . Hypertension   . Atrial fibrillation     Past Surgical History  Procedure Date  . Pacemaker insertion   . Cesarean section     Family History  Problem Relation Age of Onset  . Cancer Mother    Social History:  reports that she has never smoked. She does not have any smokeless tobacco history on file. She reports that she does not drink alcohol or use illicit drugs.  Allergies: No Known Allergies   (Not in a hospital admission)  Results for orders placed during the hospital encounter of 05/28/12 (from the past 48 hour(s))  CBC WITH DIFFERENTIAL     Status: Abnormal   Collection Time   05/28/12  2:23 PM      Component Value Range Comment   WBC 10.5  4.0 - 10.5 K/uL    RBC 4.68  3.87 - 5.11 MIL/uL    Hemoglobin 14.0  12.0 - 15.0 g/dL    HCT 56.2  13.0 - 86.5 %    MCV 86.8  78.0 - 100.0 fL    MCH 29.9  26.0 - 34.0 pg    MCHC 34.5  30.0 - 36.0 g/dL    RDW 78.4  69.6 - 29.5 %    Platelets 437 (*) 150 - 400 K/uL      Neutrophils Relative 53  43 - 77 %    Neutro Abs 5.5  1.7 - 7.7 K/uL    Lymphocytes Relative 22  12 - 46 %    Lymphs Abs 2.3  0.7 - 4.0 K/uL    Monocytes Relative 8  3 - 12 %    Monocytes Absolute 0.9  0.1 - 1.0 K/uL    Eosinophils Relative 17 (*) 0 - 5 %    Eosinophils Absolute 1.8 (*) 0.0 - 0.7 K/uL    Basophils Relative 0  0 - 1 %    Basophils Absolute 0.0  0.0 - 0.1 K/uL   COMPREHENSIVE METABOLIC PANEL     Status: Abnormal   Collection Time   05/28/12  2:23 PM      Component Value Range Comment   Sodium 135  135 - 145 mEq/L    Potassium 3.8  3.5 - 5.1 mEq/L    Chloride 99  96 - 112 mEq/L    CO2 25  19 - 32 mEq/L    Glucose, Bld 116 (*) 70 - 99 mg/dL    BUN 9  6 - 23 mg/dL    Creatinine, Ser 2.84  0.50 - 1.10 mg/dL    Calcium  9.7  8.4 - 10.5 mg/dL    Total Protein 7.8  6.0 - 8.3 g/dL    Albumin 3.7  3.5 - 5.2 g/dL    AST 161 (*) 0 - 37 U/L    ALT 403 (*) 0 - 35 U/L    Alkaline Phosphatase 313 (*) 39 - 117 U/L    Total Bilirubin 5.5 (*) 0.3 - 1.2 mg/dL    GFR calc non Af Amer >90  >90 mL/min    GFR calc Af Amer >90  >90 mL/min   LIPASE, BLOOD     Status: Normal   Collection Time   05/28/12  2:23 PM      Component Value Range Comment   Lipase 38  11 - 59 U/L   URINALYSIS, ROUTINE W REFLEX MICROSCOPIC     Status: Abnormal   Collection Time   05/28/12  5:41 PM      Component Value Range Comment   Color, Urine ORANGE (*) YELLOW BIOCHEMICALS MAY BE AFFECTED BY COLOR   APPearance CLOUDY (*) CLEAR    Specific Gravity, Urine 1.014  1.005 - 1.030    pH 6.0  5.0 - 8.0    Glucose, UA NEGATIVE  NEGATIVE mg/dL    Hgb urine dipstick NEGATIVE  NEGATIVE    Bilirubin Urine LARGE (*) NEGATIVE    Ketones, ur NEGATIVE  NEGATIVE mg/dL    Protein, ur NEGATIVE  NEGATIVE mg/dL    Urobilinogen, UA 0.2  0.0 - 1.0 mg/dL    Nitrite NEGATIVE  NEGATIVE    Leukocytes, UA SMALL (*) NEGATIVE   URINE MICROSCOPIC-ADD ON     Status: Abnormal   Collection Time   05/28/12  5:41 PM      Component  Value Range Comment   Squamous Epithelial / LPF MANY (*) RARE    WBC, UA 3-6  <3 WBC/hpf    Bacteria, UA RARE  RARE   PROTIME-INR     Status: Abnormal   Collection Time   05/29/12 12:54 AM      Component Value Range Comment   Prothrombin Time 28.6 (*) 11.6 - 15.2 seconds    INR 2.87 (*) 0.00 - 1.49    Dg Chest 2 View  05/28/2012  *RADIOLOGY REPORT*  Clinical Data: 56 year old female abdominal pain, back pain, jaundice.  CHEST - 2 VIEW  Comparison: None.  Findings: Left chest cardiac AICD.  Somewhat low lung volumes. Cardiac size and mediastinal contours are within normal limits. Visualized tracheal air column is within normal limits.  No pneumothorax, pulmonary edema or pleural effusion.  No pneumoperitoneum.  Mild scoliosis. No acute osseous abnormality identified.  IMPRESSION: Low lung volumes, otherwise no acute cardiopulmonary abnormality.   Original Report Authenticated By: Harley Hallmark, M.D.    US Abdomen Complete  05/29/2012  *RADIOLOGY REPORT*  Clinical Data:  Abdominal pain and back pain.  COMPLETE ABDOMINAL ULTRASOUND  Comparison:  CT 05/28/2012.  Findings:  Gallbladder:  Multiple gallstones are present with a large amount of biliary sludge.  Thickened wall is present although there is no sonographic Murphy's sign.  Gallbladder wall measures up to 5 mm. The largest stone measures 16 mm.  Common bile duct:  Dilated for age measuring up to 8 mm.  No common duct stone identified.  Liver:  No focal lesion identified.  Within normal limits in parenchymal echogenicity.  IVC:  Appears normal.  Pancreas:  No focal abnormality seen.  Spleen:  6.4 cm.  Normal echotexture.  Right Kidney:  10.7  cm. Normal echotexture.  Normal central sinus echo complex.  No calculi or hydronephrosis.  Left Kidney:  9.8 cm. Normal echotexture.  Normal central sinus echo complex.  No calculi or hydronephrosis.  Abdominal aorta:  No aneurysm identified.  IMPRESSION: 1.  Cholelithiasis with gallbladder wall thickening.   The absence of the sonographic Murphy's sign is atypical and suggests chronic cholecystitis however it is difficult to completely exclude acute cholecystitis. 2.  Dilation of the common bile duct without common duct stone identified.  If cholecystectomy performed, intraoperative cholangiogram recommended.   Original Report Authenticated By: Andreas Newport, M.D.    Ct Abdomen Pelvis W Contrast  05/28/2012  *RADIOLOGY REPORT*  Clinical Data: Epigastric pain.  Pain radiating to the back.  Poor appetite.  Nausea.  No vomiting.  CT ABDOMEN AND PELVIS WITH CONTRAST  Technique:  Multidetector CT imaging of the abdomen and pelvis was performed following the standard protocol during bolus administration of intravenous contrast.  Contrast:  100 ml Omnipaque-300.  Comparison: None.  Findings: Lung Bases: Dependent atelectasis.  No airspace disease.        Liver:  Mild intrahepatic biliary ductal dilation.  Correlation with liver function studies and bilirubin recommended.  No mass lesion.  Portal vein appears normal.  Spleen:  Normal.  Gallbladder:  Hydropic with probable wall thickening and pericholecystic stranding.   Small amount of pericholecystic fluid suggest acute cholecystitis.  Common bile duct:  No common duct stone identified. Normal diameter.  Pancreas:  Normal.  Adrenal glands:  Normal.  Kidneys:  Normal enhancement and excretion.  Stomach:  Small hiatal hernia.  No inflammatory changes.  Small bowel:  No mesenteric adenopathy.  No obstruction.  Colon:   Normal appendix.  No inflammatory changes of colon.  Pelvic Genitourinary:  9 cm x 7.5 cm intrauterine heterogeneous low density lesion most compatible with a large uterine fibroid.  No free fluid.  Left ovarian cystic lesion, likely cyst.  Bones:  No aggressive osseous lesions.  Levoconvex curvature of the lumbar spine.  L4-L5 predominant degenerative disc disease.  The pacemaker partially visualized.  Vasculature: Normal.  IMPRESSION:  1.  Constellation of  findings suggesting acute cholecystitis with hydropic gallbladder, probable wall thickening and pericholecystic fluid.  Mild intrahepatic biliary ductal dilation.   No calcified cyst stones are identified in the gallbladder or common bile duct. 2.  Fibroid uterus. 3.  25 mm left ovarian cystic lesion.  Follow-up 8-week pelvic ultrasound recommended to assess for resolution.   Original Report Authenticated By: Andreas Newport, M.D.     Review of Systems  Constitutional: Negative.   HENT: Negative.   Eyes: Negative.   Respiratory: Negative.   Cardiovascular: Negative.   Gastrointestinal: Positive for nausea, vomiting and abdominal pain.  Genitourinary: Negative.   Musculoskeletal: Negative.   Skin: Negative.   Neurological: Negative.   Endo/Heme/Allergies: Negative.   Psychiatric/Behavioral: Negative.     Blood pressure 130/75, pulse 67, temperature 98 F (36.7 C), temperature source Oral, resp. rate 20, height 5\' 6"  (1.676 m), weight 84.823 kg (187 lb), SpO2 98.00%. Physical Exam  Constitutional: She is oriented to person, place, and time. She appears well-developed and well-nourished. No distress.  HENT:  Head: Normocephalic and atraumatic.  Right Ear: External ear normal.  Left Ear: External ear normal.  Nose: Nose normal.  Mouth/Throat: Oropharynx is clear and moist. No oropharyngeal exudate.  Eyes: Conjunctivae normal are normal. Pupils are equal, round, and reactive to light. Right eye exhibits no discharge. Left eye exhibits no discharge.  No scleral icterus.  Neck: Normal range of motion. Neck supple.  Cardiovascular: Normal rate and regular rhythm.   Respiratory: Effort normal and breath sounds normal. No respiratory distress. She has no wheezes. She has no rales.  GI: Soft. Bowel sounds are normal. She exhibits no distension. There is no tenderness. There is no rebound.  Musculoskeletal: Normal range of motion. She exhibits no edema and no tenderness.  Neurological: She is  alert and oriented to person, place, and time.       Moves all extremities.  Skin: Skin is warm and dry. She is not diaphoretic.     Assessment/Plan #1. Abdominal pain with jaundice and cholecystitis - patient will be kept n.p.o. Patient has been on empiric antibiotics. Dr. Elnoria Howard from gastroenterologist has been consulted for possible ERCP. And surgery will be following patient in consult. Further recommendations per gastroenterologist and surgery. #2. Coagulopathy secondary to Coumadin - since anticipating procedures patient's Coumadin will be on hold and I have ordered vitamin K 5 mg IV and patient will be on heparin which has to be stopped 2 hours before any procedure and restarted whenever okay with the surgeon on gastroenterologist according to the procedure done. Recheck INR in a.m. Type and screen as if patient needs FFP. #3. Atrial fibrillation and tachybradycardia syndrome presently rate is uncontrolled - I have ordered metoprolol 5 mg IV every 6 hourly scheduled dose. Closely monitor heart rate.  Since patient's heart rate was initially elevated we will be observing in step down.  CODE STATUS - full code.    KAKRAKANDY,ARSHAD N. 05/29/2012, 2:31 AM

## 2012-05-29 NOTE — Progress Notes (Addendum)
[facility] Progress Note [place] TEAM 1 - Stepdown/ICU TEAM   Beth May WUJ:[id number] DOB: Dec 12, 1955 DOA: 05/28/2012 PCP: No primary provider on file.  Brief narrative: 56 year old female patient with underlying history of atrial fibrillation post pacemaker placement and is on chronic Coumadin. Patient reports she is visiting from [place]. Has been having ongoing right upper quarter abdominal pain as well as back pain for 3 weeks. Symptoms worsened over the past 24 hours and she presented to the ER. CT done at that time revealed probable cholecystitis. On physical exam she was found to be jaundiced. Admitting laboratory data revealed transaminitis with an elevated total bilirubin. She was afebrile. In the ER she was tachycardic and appeared to be dehydrated. Because of her cardiac history and concerns for risk for developing ascending cholangitis patient was admitted to the step down unit for further monitoring and treatment.  Assessment/Plan:  Acute cholecystitis with chronic cholecystitis/Choledocholithiasis *Appreciate surgery and GI input *ERCP planned for today but was cancelled as Heparin was not discontinued as ordered- rescheduled for tomorrow.  *Continue empiric Cipro and Flagyl- surgery to be planned for 8-12 weeks from now  Dehydration *continue IV fluid hydration  Hyperbilirubinemia/Transaminitis *continue to follow trends *Current total bilirubin stable  Atrial fibrillation maintaining V-paced rhythm *no history of coronary disease reported *has P waves before the patient V paced rhythm *no cardiologist or previous cardiac workup in the [facility] area therefore we will obtain a 2-D echocardiogram this admission *Cardiology consult was requested for Pre-op eval- she has been cleared for surgery.   Warfarin anticoagulation *INR was therapeutic at presentation and patient has been given vitamin K x1 for reversal *pharmacy managing heparin and warfarin dosing  with warfarin on hold for anticipated invasive procedures  DVT prophylaxis: Was therapeutic on warfarin prior to admission and currently on IV heparin Code Status: full Family Communication: spoke directly with patient Disposition Plan: transfer to surgical floor  Consultants: General surgery Gastroenterology  Procedures: none  Antibiotics: Cipro 9/22 >>> Flagyl 9/22 >>>  HPI/Subjective: Pt alert and sitting in chair. She states pain is more tolerable- she points to her RUQ and right back to show me where the pain is. She has mild nausea. No other symptoms today.    Objective: Blood pressure 116/61, pulse 61, temperature 98.6 F (37 C), temperature source Oral, resp. rate 14, height 5\' 6"  (1.676 m), weight 83 kg (182 lb 15.7 oz), SpO2 97.00%.  Intake/Output Summary (Last 24 hours) at 05/29/12 1103 Last data filed at 05/29/12 1000  Gross per 24 hour  Intake    948 ml  Output   1075 ml  Net   -127 ml     Exam: Gen- alert, oriented, appears comfortable Lungs- CTA b/l  CVA- RRR, no murmurs Abd- soft, mild tenderness in RUQ, BS+, non-tender Ext- no c/c/e  Data Reviewed: Basic Metabolic Panel:  Lab 05/29/12 9562 05/28/12 1423  NA 138 135  K 3.7 3.8  CL 102 99  CO2 24 25  GLUCOSE 88 116*  BUN 8 9  CREATININE 0.69 0.73  CALCIUM 9.4 9.7  MG -- --  PHOS -- --   Liver Function Tests:  Lab 05/29/12 0534 05/28/12 1423  AST 165* 195*  ALT 336* 403*  ALKPHOS 286* 313*  BILITOT 5.8* 5.5*  PROT 7.0 7.8  ALBUMIN 3.4* 3.7    Lab 05/28/12 1423  LIPASE 38  AMYLASE --   CBC:  Lab 05/29/12 0534 05/28/12 1423  WBC 9.5 10.5  NEUTROABS 5.1 5.5  HGB 13.3 14.0  HCT 39.3 40.6  MCV 87.1 86.8  PLT 399 437*   Cardiac Enzymes:  Lab 05/29/12 0534  CKTOTAL --  CKMB --  CKMBINDEX --  TROPONINI <0.30   CBG:  Lab 05/29/12 0731  GLUCAP 118*    Recent Results (from the past 240 hour(s))  MRSA PCR SCREENING     Status: Normal   Collection Time   05/29/12  3:39  AM      Component Value Range Status Comment   MRSA by PCR NEGATIVE  NEGATIVE Final      Studies:  Recent x-ray studies have been reviewed in detail by the Attending Physician  Scheduled Meds:  Reviewed in detail by the Attending Physician   Calvert Cantor, MD [phone]  If 7PM-7AM, please contact night-coverage [email or web address] Password [email or web address] 05/29/2012, 11:03 AM   LOS: 1 day

## 2012-05-29 NOTE — Progress Notes (Signed)
ANTICOAGULATION CONSULT NOTE - Follow Up Consult  Pharmacy Consult for Heparin Indication: Hx of atrial fibrillation  No Known Allergies  Patient Measurements: Height: 5\' 6"  (167.6 cm) Weight: 182 lb 15.7 oz (83 kg) IBW/kg (Calculated) : 59.3  Heparin Dosing Weight: 76.8 kg  Vital Signs: Temp: 98.3 F (36.8 C) (09/23 1105) Temp src: Oral (09/23 1105) BP: 148/70 mmHg (09/23 1105) Pulse Rate: 61  (09/23 0700)  Labs:  Basename 05/29/12 1516 05/29/12 1012 05/29/12 0534 05/29/12 0054 05/28/12 1423  HGB -- -- 13.3 -- 14.0  HCT -- -- 39.3 -- 40.6  PLT -- -- 399 -- 437*  APTT -- -- -- -- --  LABPROT -- -- 21.0* 28.6* --  INR -- -- 1.89* 2.87* --  HEPARINUNFRC 0.23* -- -- -- --  CREATININE -- -- 0.69 -- 0.73  CKTOTAL -- -- -- -- --  CKMB -- -- -- -- --  TROPONINI <0.30 <0.30 <0.30 -- --    Estimated Creatinine Clearance: 85.3 ml/min (by C-G formula based on Cr of 0.69).   Medications:  Scheduled:    . ciprofloxacin  400 mg Intravenous BID  . influenza  inactive virus vaccine  0.5 mL Intramuscular Tomorrow-1000  . metoprolol  5 mg Intravenous QID  . metronidazole  500 mg Intravenous Q8H  . phytonadione (VITAMIN K) IV  5 mg Intravenous Once  . sodium chloride  250 mL Intravenous Once  . sodium chloride  3 mL Intravenous Q12H  . DISCONTD: sodium chloride   Intravenous STAT  . DISCONTD: HYDROmorphone  1 mg Intravenous Once  . DISCONTD: ondansetron  4 mg Intravenous Once    Assessment: 56 yo female admitted for cholecystitis, awaiting ERCP likely tomorrow, with PTA use of Coumadin for afib. Coumadin on hold and given Vitamin K 5 mg IV given at 0300 in ED.  Heparin initiated for bridge therapy. HL returned subtherapeutic at 0.23 on heparin 1200 units/hr. CBC WNL.  Goal of Therapy:  Heparin level 0.3-0.7 units/ml Monitor platelets by anticoagulation protocol: Yes   Plan:  -Give 1000 units bolus X 1 -Increase heparin infusion to 1400 units/hr -Check HL in 8 hours and  daily while on heparin -Monitor H/H, plt, s/sx of bleeding  Tiney Rouge, PharmD Candidate 05/29/2012,4:19 PM  I have reviewed the above and agree with the plan noted.  Harland German, Pharm D 05/29/2012 4:38 PM

## 2012-05-30 DIAGNOSIS — K8050 Calculus of bile duct without cholangitis or cholecystitis without obstruction: Secondary | ICD-10-CM

## 2012-05-30 DIAGNOSIS — K812 Acute cholecystitis with chronic cholecystitis: Secondary | ICD-10-CM

## 2012-05-30 LAB — CBC
HCT: 39.6 % (ref 36.0–46.0)
Hemoglobin: 13.4 g/dL (ref 12.0–15.0)
MCHC: 33.8 g/dL (ref 30.0–36.0)
MCV: 87.6 fL (ref 78.0–100.0)
RDW: 13.5 % (ref 11.5–15.5)

## 2012-05-30 LAB — LIPASE, BLOOD: Lipase: 35 U/L (ref 11–59)

## 2012-05-30 LAB — COMPREHENSIVE METABOLIC PANEL
Alkaline Phosphatase: 282 U/L — ABNORMAL HIGH (ref 39–117)
BUN: 6 mg/dL (ref 6–23)
Chloride: 106 mEq/L (ref 96–112)
Creatinine, Ser: 0.75 mg/dL (ref 0.50–1.10)
GFR calc Af Amer: >90 mL/min (ref >90)
Glucose, Bld: 116 mg/dL — ABNORMAL HIGH (ref 70–99)
Potassium: 3.7 mEq/L (ref 3.5–5.1)
Total Bilirubin: 8.8 mg/dL — ABNORMAL HIGH (ref 0.3–1.2)

## 2012-05-30 LAB — HEPATIC FUNCTION PANEL
Alkaline Phosphatase: 268 U/L — ABNORMAL HIGH (ref 39–117)
Bilirubin, Direct: 6.7 mg/dL — ABNORMAL HIGH (ref 0.0–0.3)
Indirect Bilirubin: 1.6 mg/dL — ABNORMAL HIGH (ref 0.3–0.9)
Total Protein: 6.8 g/dL (ref 6.0–8.3)

## 2012-05-30 LAB — HEPARIN LEVEL (UNFRACTIONATED): Heparin Unfractionated: 0.87 IU/mL — ABNORMAL HIGH (ref 0.30–0.70)

## 2012-05-30 LAB — GLUCOSE, CAPILLARY: Glucose-Capillary: 108 mg/dL — ABNORMAL HIGH (ref 70–99)

## 2012-05-30 LAB — PROTIME-INR: Prothrombin Time: 14.8 seconds (ref 11.6–15.2)

## 2012-05-30 MED ORDER — HEPARIN (PORCINE) IN NACL 100-0.45 UNIT/ML-% IJ SOLN
1200.0000 [IU]/h | INTRAMUSCULAR | Status: AC
  Administered 2012-05-30 – 2012-05-31 (×2): 1200 [IU]/h via INTRAVENOUS
  Filled 2012-05-30 (×3): qty 250

## 2012-05-30 NOTE — Progress Notes (Signed)
ANTICOAGULATION CONSULT NOTE - Follow Up Consult  Pharmacy Consult for Heparin Indication: h/o afib  No Known Allergies  Patient Measurements: Height: 5\' 6"  (167.6 cm) Weight: 184 lb 8.4 oz (83.7 kg) IBW/kg (Calculated) : 59.3  Heparin Dosing Weight: 77 kg  Vital Signs: Temp: 98.4 F (36.9 C) (09/24 0757) Temp src: Oral (09/24 0757) BP: 147/83 mmHg (09/24 0800) Pulse Rate: 63  (09/24 0757)  Labs:  Basename 05/30/12 1238 05/30/12 0450 05/29/12 2335 05/29/12 1516 05/29/12 1012 05/29/12 0534 05/29/12 0054 05/28/12 1423  HGB -- 13.4 -- -- -- 13.3 -- --  HCT -- 39.6 -- -- -- 39.3 -- 40.6  PLT -- 434* -- -- -- 399 -- 437*  APTT -- -- -- -- -- -- -- --  LABPROT -- 14.8 -- -- -- 21.0* 28.6* --  INR -- 1.18 -- -- -- 1.89* 2.87* --  HEPARINUNFRC 0.67 0.87* 0.52 -- -- -- -- --  CREATININE -- 0.75 -- -- -- 0.69 -- 0.73  CKTOTAL -- -- -- -- -- -- -- --  CKMB -- -- -- -- -- -- -- --  TROPONINI -- -- -- <0.30 <0.30 <0.30 -- --    Estimated Creatinine Clearance: 85.7 ml/min (by C-G formula based on Cr of 0.75).   Medications:  Scheduled:    . ciprofloxacin  400 mg Intravenous BID  . heparin  1,000 Units Intravenous Once  . influenza  inactive virus vaccine  0.5 mL Intramuscular Tomorrow-1000  . metoprolol  5 mg Intravenous QID  . metronidazole  500 mg Intravenous Q8H  . sodium chloride  3 mL Intravenous Q12H    Assessment: 56 yo female admitted for cholecystitis, awaiting ERCP likely tomorrow, h/o of afib for heparin. HL returned therapeutic at 0.67 on heparin 1200 units/hr. CBC WNL w/ elevated plt. No bleeding noted.  Goal of Therapy:  Heparin level 0.3-0.7 units/ml Monitor platelets by anticoagulation protocol: Yes   Plan:  -Continue heparin 1200 units/ml -Daily HL, CBC, and monitor s/sx bleeding  Tiney Rouge, PharmD Candidate 05/30/2012,1:29 PM   I have reviewed the plan above and agree with the recommendations.  Harland German, Pharm D 05/30/2012 3:18  PM

## 2012-05-30 NOTE — Progress Notes (Signed)
ANTICOAGULATION CONSULT NOTE   Pharmacy Consult for Heparin Indication: h/o Afib  No Known Allergies  Patient Measurements: Height: 5\' 6"  (167.6 cm) Weight: 182 lb 15.7 oz (83 kg) IBW/kg (Calculated) : 59.3  Heparin Dosing Weight: 80 kg   Vital Signs: Temp: 98.5 F (36.9 C) (09/23 1949) Temp src: Oral (09/23 1949) BP: 130/59 mmHg (09/23 2339)  Labs:  Basename 05/29/12 2335 05/29/12 1516 05/29/12 1012 05/29/12 0534 05/29/12 0054 05/28/12 1423  HGB -- -- -- 13.3 -- 14.0  HCT -- -- -- 39.3 -- 40.6  PLT -- -- -- 399 -- 437*  APTT -- -- -- -- -- --  LABPROT -- -- -- 21.0* 28.6* --  INR -- -- -- 1.89* 2.87* --  HEPARINUNFRC 0.52 0.23* -- -- -- --  CREATININE -- -- -- 0.69 -- 0.73  CKTOTAL -- -- -- -- -- --  CKMB -- -- -- -- -- --  TROPONINI -- <0.30 <0.30 <0.30 -- --    Estimated Creatinine Clearance: 85.3 ml/min (by C-G formula based on Cr of 0.69).  Assessment: 56 yo female admitted with cholecystitis, awaiting possible ERCP, h/o Afib for Heparin   Goal of Therapy:  Heparin level 0.3-0.7 units/ml Monitor platelets by anticoagulation protocol: Yes   Plan:  Continue Heparin at current rate  [name] 05/30/2012,12:42 AM

## 2012-05-30 NOTE — Progress Notes (Signed)
Beth May is a 56 y.o. female patient who transferred  from 2900 to 5522,  awake, alert  & orientated  X 3, Full Code, VSS - Blood pressure 144/82, pulse 61, temperature 98.7 F (37.1 C), temperature source Oral, resp. rate 20, height 5\' 6"  (1.676 m), weight 83.7 kg (184 lb 8.4 oz), SpO2 98.00%.,  R/A, no c/o shortness of breath, no c/o chest pain, no distress noted. Tele # [id number] placed and pt is currently running:AV paced.   IV site WDL: forearm right and right hand, condition patent and no redness with a transparent dsg that's clean dry and intact.  Allergies:  No Known Allergies   Past Medical History  Diagnosis Date  . ICD (implantable cardiac defibrillator) in place 2009    [employer]  . Hypertension   . Atrial fibrillation 2009  . Pacemaker   . Bell's palsy 2008    Pt orientation to unit, room and routine. SR up x 2. Pt verbalizes an understanding of how to use the call bell and to call for help before getting out of bed.  Skin, clean-dry- intact without evidence of bruising, or skin tears.   No evidence of skin break down noted on exam. no wounds    Will cont to monitor and assist as needed.  Joana Reamer, RN 05/30/2012 1730

## 2012-05-30 NOTE — Progress Notes (Signed)
[facility] Daily Rounding Note 05/30/2012, 11:21 AM  SUBJECTIVE:       ongoing right abd pain and back pain along with nausea.   OBJECTIVE:         Vital signs in last 24 hours:    Temp:  [98.4 F (36.9 C)-98.8 F (37.1 C)] 98.4 F (36.9 C) (09/24 0757) Pulse Rate:  [63] 63  (09/24 0757) Resp:  [16] 16  (09/23 1949) BP: (130-151)/(59-83) 147/83 mmHg (09/24 0800) SpO2:  [96 %-97 %] 96 % (09/24 0455) Weight:  [184 lb 8.4 oz (83.7 kg)] 184 lb 8.4 oz (83.7 kg) (09/24 0455)   General: looks moderately ill   Heart: RRR Chest: Clear B  Abdomen: soft, tender RUQ, no G or R  Extremities: no pedal edema Neuro/Psych:  Pleasant, not confused or agitated.   Intake/Output from previous day: 09/23 0701 - 09/24 0700 In: 3372 [P.O.:720; I.V.:2252; IV Piggyback:400] Out: 1325 [Urine:1325]  Lab Results:  Basename 05/30/12 0450 05/29/12 0534 05/28/12 1423  WBC 11.5* 9.5 10.5  HGB 13.4 13.3 14.0  HCT 39.6 39.3 40.6  PLT 434* 399 437*   BMET  Basename 05/30/12 0450 05/29/12 0534 05/28/12 1423  NA 140 138 135  K 3.7 3.7 3.8  CL 106 102 99  CO2 22 24 25   GLUCOSE 116* 88 116*  BUN 6 8 9   CREATININE 0.75 0.69 0.73  CALCIUM 9.2 9.4 9.7   LFT  Basename 05/30/12 0450 05/29/12 2335 05/29/12 0534  PROT 7.0 6.8 7.0  ALBUMIN 3.3* 3.3* 3.4*  AST 175* 170* 165*  ALT 306* 311* 336*  ALKPHOS 282* 268* 286*  BILITOT 8.8* 8.3* 5.8*  BILIDIR -- 6.7* --  IBILI -- 1.6* --   PT/INR  Basename 05/30/12 0450 05/29/12 0534  LABPROT 14.8 21.0*  INR 1.18 1.89*    Studies/Results:  US Abdomen Complete 05/29/2012  *RADIOLOGY REPORT*  Clinical Data:  Abdominal pain and back pain.  COMPLETE ABDOMINAL ULTRASOUND  Comparison:  CT 05/28/2012.  Findings:  Gallbladder:  Multiple gallstones are present with a large amount of biliary sludge.  Thickened wall is present although there is no sonographic Murphy's sign.  Gallbladder wall measures up to 5 mm. The largest stone measures 16 mm.  Common bile  duct:  Dilated for age measuring up to 8 mm.  No common duct stone identified.  Liver:  No focal lesion identified.  Within normal limits in parenchymal echogenicity.  IVC:  Appears normal.  Pancreas:  No focal abnormality seen.  Spleen:  6.4 cm.  Normal echotexture.  Right Kidney:  10.7 cm. Normal echotexture.  Normal central sinus echo complex.  No calculi or hydronephrosis.  Left Kidney:  9.8 cm. Normal echotexture.  Normal central sinus echo complex.  No calculi or hydronephrosis.  Abdominal aorta:  No aneurysm identified.  IMPRESSION: 1.  Cholelithiasis with gallbladder wall thickening.  The absence of the sonographic Murphy's sign is atypical and suggests chronic cholecystitis however it is difficult to completely exclude acute cholecystitis. 2.  Dilation of the common bile duct without common duct stone identified.  If cholecystectomy performed, intraoperative cholangiogram recommended.   Original Report Authenticated By: Andreas Newport, M.D.    Ct Abdomen Pelvis W Contrast 05/28/2012  *RADIOLOGY REPORT*  Clinical Data: Epigastric pain.  Pain radiating to the back.  Poor appetite.  Nausea.  No vomiting.  CT ABDOMEN AND PELVIS WITH CONTRAST  Technique:  Multidetector CT imaging of the abdomen and pelvis was performed following the standard  protocol during bolus administration of intravenous contrast.  Contrast:  100 ml Omnipaque-300.  Comparison: None.  Findings: Lung Bases: Dependent atelectasis.  No airspace disease.        Liver:  Mild intrahepatic biliary ductal dilation.  Correlation with liver function studies and bilirubin recommended.  No mass lesion.  Portal vein appears normal.  Spleen:  Normal.  Gallbladder:  Hydropic with probable wall thickening and pericholecystic stranding.   Small amount of pericholecystic fluid suggest acute cholecystitis.  Common bile duct:  No common duct stone identified. Normal diameter.  Pancreas:  Normal.  Adrenal glands:  Normal.  Kidneys:  Normal enhancement and  excretion.  Stomach:  Small hiatal hernia.  No inflammatory changes.  Small bowel:  No mesenteric adenopathy.  No obstruction.  Colon:   Normal appendix.  No inflammatory changes of colon.  Pelvic Genitourinary:  9 cm x 7.5 cm intrauterine heterogeneous low density lesion most compatible with a large uterine fibroid.  No free fluid.  Left ovarian cystic lesion, likely cyst.  Bones:  No aggressive osseous lesions.  Levoconvex curvature of the lumbar spine.  L4-L5 predominant degenerative disc disease.  The pacemaker partially visualized.  Vasculature: Normal.  IMPRESSION:  1.  Constellation of findings suggesting acute cholecystitis with hydropic gallbladder, probable wall thickening and pericholecystic fluid.  Mild intrahepatic biliary ductal dilation.   No calcified cyst stones are identified in the gallbladder or common bile duct. 2.  Fibroid uterus. 3.  25 mm left ovarian cystic lesion.  Follow-up 8-week pelvic ultrasound recommended to assess for resolution.   Original Report Authenticated By: Andreas Newport, M.D.     ASSESMENT: * Symptomatic cholelithiasis, chronic cholecystitis. Pt not toxic. On empiric Cipro and Flagyl. Plans cholecystectomy back home in [place].  ( I misunderstood and thought she lived in [place]) * Elevated LFTs and 8 mm CBD on ultrasound. R/O Choledocholithiasis. LFTs improved.  * Chronic coumadin, hx A fib. INR in decline. Got single dose of IV Vit K. Heparin discontinued for now. .  * S/P ICD/pacemaker.  Hx class 3 CHF, now with normal LV fx.   * Hyperglycemia.    PLAN: *  ERCP today.    Had to cancel ERCP today because heparin was not stopped as ordered.  ERCP rescheduled for 1245 tomorrow.   LOS: 2 days   Beth May  05/30/2012, 11:21 AM Pager: [phone]    I have taken an interval history, reviewed the chart and examined the patient. I agree with the extender's note, impression and recommendations. ERCP rescheduled for tomorrow to evaluate for  choledocholithiasis. Pt needs to be off IV heparin for 6 hours prior to ERCP.  Venita Lick. Russella Dar MD [facility] Graham

## 2012-05-30 NOTE — Progress Notes (Signed)
   Subjective:  Patient continues to complain of back pain. Rhythm is NSR with Vpacing (has BiV ICD which was interrogated yesterday.  See report in paper chart. Prior history of class III CHF but her LV function is now normal on carvedilol and ACEi. Warfarin  Currently on hold for history of prior atrial fib.  Objective:  Vital Signs in the last 24 hours: Temp:  [98.3 F (36.8 C)-98.8 F (37.1 C)] 98.4 F (36.9 C) (09/24 0757) Pulse Rate:  [63] 63  (09/24 0757) Resp:  [15-16] 16  (09/23 1949) BP: (130-151)/(59-83) 147/83 mmHg (09/24 0800) SpO2:  [96 %-97 %] 96 % (09/24 0455) Weight:  [184 lb 8.4 oz (83.7 kg)] 184 lb 8.4 oz (83.7 kg) (09/24 0455)  Intake/Output from previous day: 09/23 0701 - 09/24 0700 In: 3372 [P.O.:720; I.V.:2252; IV Piggyback:400] Out: 1325 [Urine:1325] Intake/Output from this shift: Total I/O In: 261 [I.V.:261] Out: -      . ciprofloxacin  400 mg Intravenous BID  . heparin  1,000 Units Intravenous Once  . influenza  inactive virus vaccine  0.5 mL Intramuscular Tomorrow-1000  . metoprolol  5 mg Intravenous QID  . metronidazole  500 mg Intravenous Q8H  . sodium chloride  3 mL Intravenous Q12H      . sodium chloride 75 mL/hr at 05/29/12 1725  . heparin 1,200 Units/hr (05/30/12 0600)  . DISCONTD: sodium chloride    . DISCONTD: heparin 1,200 Units/hr (05/29/12 1100)    Physical Exam: The patient appears to be in no distress.  Lungs are clear to P and A. Heart reveals no gallop. Abdomen reveals mild RUQ tenderness. Extremities show no phlebitis or edema.  Lab Results:  Basename 05/30/12 0450 05/29/12 0534  WBC 11.5* 9.5  HGB 13.4 13.3  PLT 434* 399    Basename 05/30/12 0450 05/29/12 0534  NA 140 138  K 3.7 3.7  CL 106 102  CO2 22 24  GLUCOSE 116* 88  BUN 6 8  CREATININE 0.75 0.69    Basename 05/29/12 1516 05/29/12 1012  TROPONINI <0.30 <0.30   Hepatic Function Panel  Basename 05/30/12 0450 05/29/12 2335  PROT 7.0 --    ALBUMIN 3.3* --  AST 175* --  ALT 306* --  ALKPHOS 282* --  BILITOT 8.8* --  BILIDIR -- 6.7*  IBILI -- 1.6*   No results found for this basename: CHOL in the last 72 hours No results found for this basename: PROTIME in the last 72 hours  Imaging: Imaging results have been reviewed  Cardiac Studies:  Assessment/Plan:  Patient Active Hospital Problem List: 1. Past history of chronic systolic CHF now with normal EF 55-60 % on appropriate outpatient therapy.  2. Past history of PAF maintaining NSR.  Warfarin to be restarted after ERCP  3. Cholecystitis.  For ERCP today. She plans to return to [place] where her family and physicians are. Plan will be for eventual elective cholecystectomy there.  Rec: on discharge she should resume home cardiac meds.  Since she will be on antibiotics as well, she will need more frequent protimes in the short term.    LOS: 2 days    Cassell Clement 05/30/2012, 10:48 AM

## 2012-05-30 NOTE — Progress Notes (Signed)
ANTICOAGULATION CONSULT NOTE   Pharmacy Consult for Heparin Indication: h/o Afib  No Known Allergies  Patient Measurements: Height: 5\' 6"  (167.6 cm) Weight: 184 lb 8.4 oz (83.7 kg) IBW/kg (Calculated) : 59.3  Heparin Dosing Weight: 80 kg   Vital Signs: Temp: 98.8 F (37.1 C) (09/24 0455) Temp src: Oral (09/24 0455) BP: 138/78 mmHg (09/24 0455)  Labs:  Basename 05/30/12 0450 05/29/12 2335 05/29/12 1516 05/29/12 1012 05/29/12 0534 05/29/12 0054 05/28/12 1423  HGB 13.4 -- -- -- 13.3 -- --  HCT 39.6 -- -- -- 39.3 -- 40.6  PLT 434* -- -- -- 399 -- 437*  APTT -- -- -- -- -- -- --  LABPROT 14.8 -- -- -- 21.0* 28.6* --  INR 1.18 -- -- -- 1.89* 2.87* --  HEPARINUNFRC 0.87* 0.52 0.23* -- -- -- --  CREATININE 0.75 -- -- -- 0.69 -- 0.73  CKTOTAL -- -- -- -- -- -- --  CKMB -- -- -- -- -- -- --  TROPONINI -- -- <0.30 <0.30 <0.30 -- --    Estimated Creatinine Clearance: 85.7 ml/min (by C-G formula based on Cr of 0.75).  Assessment: 56 yo female admitted with cholecystitis, awaiting  ERCP, h/o Afib for Heparin   Goal of Therapy:  Heparin level 0.3-0.7 units/ml Monitor platelets by anticoagulation protocol: Yes   Plan:  Decrease Heparin 1200 units/hr F/U after ERCP this afternoon.  Dheeraj Hail, Gary Fleet 05/30/2012,5:59 AM

## 2012-05-30 NOTE — Progress Notes (Signed)
Subjective: Resting quietly in bed still with some abdominal discomfort.   Objective: Vital signs in last 24 hours: Temp:  [98.3 F (36.8 C)-98.8 F (37.1 C)] 98.4 F (36.9 C) (09/24 0757) Pulse Rate:  [63] 63  (09/24 0757) Resp:  [15-16] 16  (09/23 1949) BP: (130-151)/(59-83) 147/83 mmHg (09/24 0757) SpO2:  [96 %-97 %] 96 % (09/24 0455) Weight:  [184 lb 8.4 oz (83.7 kg)] 184 lb 8.4 oz (83.7 kg) (09/24 0455)    Intake/Output from previous day: 09/23 0701 - 09/24 0700 In: 3285 [P.O.:720; I.V.:2165; IV Piggyback:400] Out: 1325 [Urine:1325] Intake/Output this shift:    General appearance: alert, cooperative, appears stated age and no distress Chest: CTA Cardiac: RRR Abdomen: still with RUQ tenderness with palpation, soft, + BS. VSS, afebrile, HD stable. WBC have trended upwards slightly.  LFT's and Tbili also remain elevated. Lab Results:   Basename 05/30/12 0450 05/29/12 0534  WBC 11.5* 9.5  HGB 13.4 13.3  HCT 39.6 39.3  PLT 434* 399   BMET  Basename 05/30/12 0450 05/29/12 0534  NA 140 138  K 3.7 3.7  CL 106 102  CO2 22 24  GLUCOSE 116* 88  BUN 6 8  CREATININE 0.75 0.69  CALCIUM 9.2 9.4   PT/INR  Basename 05/30/12 0450 05/29/12 0534  LABPROT 14.8 21.0*  INR 1.18 1.89*   ABG No results found for this basename: PHART:2,PCO2:2,PO2:2,HCO3:2 in the last 72 hours  Studies/Results: Dg Chest 2 View  05/28/2012  *RADIOLOGY REPORT*  Clinical Data: 56 year old female abdominal pain, back pain, jaundice.  CHEST - 2 VIEW  Comparison: None.  Findings: Left chest cardiac AICD.  Somewhat low lung volumes. Cardiac size and mediastinal contours are within normal limits. Visualized tracheal air column is within normal limits.  No pneumothorax, pulmonary edema or pleural effusion.  No pneumoperitoneum.  Mild scoliosis. No acute osseous abnormality identified.  IMPRESSION: Low lung volumes, otherwise no acute cardiopulmonary abnormality.   Original Report Authenticated By:  Harley Hallmark, M.D.    US Abdomen Complete  05/29/2012  *RADIOLOGY REPORT*  Clinical Data:  Abdominal pain and back pain.  COMPLETE ABDOMINAL ULTRASOUND  Comparison:  CT 05/28/2012.  Findings:  Gallbladder:  Multiple gallstones are present with a large amount of biliary sludge.  Thickened wall is present although there is no sonographic Murphy's sign.  Gallbladder wall measures up to 5 mm. The largest stone measures 16 mm.  Common bile duct:  Dilated for age measuring up to 8 mm.  No common duct stone identified.  Liver:  No focal lesion identified.  Within normal limits in parenchymal echogenicity.  IVC:  Appears normal.  Pancreas:  No focal abnormality seen.  Spleen:  6.4 cm.  Normal echotexture.  Right Kidney:  10.7 cm. Normal echotexture.  Normal central sinus echo complex.  No calculi or hydronephrosis.  Left Kidney:  9.8 cm. Normal echotexture.  Normal central sinus echo complex.  No calculi or hydronephrosis.  Abdominal aorta:  No aneurysm identified.  IMPRESSION: 1.  Cholelithiasis with gallbladder wall thickening.  The absence of the sonographic Murphy's sign is atypical and suggests chronic cholecystitis however it is difficult to completely exclude acute cholecystitis. 2.  Dilation of the common bile duct without common duct stone identified.  If cholecystectomy performed, intraoperative cholangiogram recommended.   Original Report Authenticated By: Andreas Newport, M.D.    Ct Abdomen Pelvis W Contrast  05/28/2012  *RADIOLOGY REPORT*  Clinical Data: Epigastric pain.  Pain radiating to the back.  Poor appetite.  Nausea.  No vomiting.  CT ABDOMEN AND PELVIS WITH CONTRAST  Technique:  Multidetector CT imaging of the abdomen and pelvis was performed following the standard protocol during bolus administration of intravenous contrast.  Contrast:  100 ml Omnipaque-300.  Comparison: None.  Findings: Lung Bases: Dependent atelectasis.  No airspace disease.        Liver:  Mild intrahepatic biliary ductal  dilation.  Correlation with liver function studies and bilirubin recommended.  No mass lesion.  Portal vein appears normal.  Spleen:  Normal.  Gallbladder:  Hydropic with probable wall thickening and pericholecystic stranding.   Small amount of pericholecystic fluid suggest acute cholecystitis.  Common bile duct:  No common duct stone identified. Normal diameter.  Pancreas:  Normal.  Adrenal glands:  Normal.  Kidneys:  Normal enhancement and excretion.  Stomach:  Small hiatal hernia.  No inflammatory changes.  Small bowel:  No mesenteric adenopathy.  No obstruction.  Colon:   Normal appendix.  No inflammatory changes of colon.  Pelvic Genitourinary:  9 cm x 7.5 cm intrauterine heterogeneous low density lesion most compatible with a large uterine fibroid.  No free fluid.  Left ovarian cystic lesion, likely cyst.  Bones:  No aggressive osseous lesions.  Levoconvex curvature of the lumbar spine.  L4-L5 predominant degenerative disc disease.  The pacemaker partially visualized.  Vasculature: Normal.  IMPRESSION:  1.  Constellation of findings suggesting acute cholecystitis with hydropic gallbladder, probable wall thickening and pericholecystic fluid.  Mild intrahepatic biliary ductal dilation.   No calcified cyst stones are identified in the gallbladder or common bile duct. 2.  Fibroid uterus. 3.  25 mm left ovarian cystic lesion.  Follow-up 8-week pelvic ultrasound recommended to assess for resolution.   Original Report Authenticated By: Andreas Newport, M.D.     Anti-infectives: Anti-infectives     Start     Dose/Rate Route Frequency Ordered Stop   05/29/12 0400   metroNIDAZOLE (FLAGYL) IVPB 500 mg        500 mg 100 mL/hr over 60 Minutes Intravenous 3 times per day 05/29/12 0337     05/29/12 0400   ciprofloxacin (CIPRO) IVPB 400 mg        400 mg 200 mL/hr over 60 Minutes Intravenous 2 times daily 05/29/12 0341            Assessment/Plan: s/p Procedure(s) (LRB) with comments: ENDOSCOPIC RETROGRADE  CHOLANGIOPANCREATOGRAPHY (ERCP) (N/A) Patient has been cleared by cardiology for ERCP. Recommend waiting on post ERCP cholecystectomy, sending home on ABX for week to 10 days; with f/u in 2-3 months time for an elective cholecystectomy then.    LOS: 2 days    Caitlyn Buchanan 05/30/2012

## 2012-05-31 ENCOUNTER — Encounter (HOSPITAL_COMMUNITY): Payer: Self-pay | Admitting: *Deleted

## 2012-05-31 ENCOUNTER — Inpatient Hospital Stay (HOSPITAL_COMMUNITY): Payer: Medicare (Managed Care)

## 2012-05-31 ENCOUNTER — Encounter (HOSPITAL_COMMUNITY)
Admission: EM | Disposition: A | Discharge status: Home / Self Care | Payer: Self-pay | Source: Home / Self Care | Attending: Internal Medicine

## 2012-05-31 DIAGNOSIS — K8050 Calculus of bile duct without cholangitis or cholecystitis without obstruction: Secondary | ICD-10-CM

## 2012-05-31 DIAGNOSIS — R109 Unspecified abdominal pain: Secondary | ICD-10-CM

## 2012-05-31 HISTORY — PX: ERCP: SHX5425

## 2012-05-31 LAB — COMPREHENSIVE METABOLIC PANEL
ALT: 277 U/L — ABNORMAL HIGH (ref 0–35)
Alkaline Phosphatase: 259 U/L — ABNORMAL HIGH (ref 39–117)
BUN: 5 mg/dL — ABNORMAL LOW (ref 6–23)
CO2: 23 mEq/L (ref 19–32)
GFR calc Af Amer: >90 mL/min (ref >90)
GFR calc non Af Amer: >90 mL/min (ref >90)
Glucose, Bld: 135 mg/dL — ABNORMAL HIGH (ref 70–99)
Potassium: 3.4 mEq/L — ABNORMAL LOW (ref 3.5–5.1)
Sodium: 137 mEq/L (ref 135–145)
Total Bilirubin: 8.5 mg/dL — ABNORMAL HIGH (ref 0.3–1.2)
Total Protein: 6.6 g/dL (ref 6.0–8.3)

## 2012-05-31 LAB — CBC
MCHC: 33.7 g/dL (ref 30.0–36.0)
Platelets: 348 10*3/uL (ref 150–400)
RDW: 13.7 % (ref 11.5–15.5)
WBC: 11.4 10*3/uL — ABNORMAL HIGH (ref 4.0–10.5)

## 2012-05-31 LAB — SURGICAL PCR SCREEN
MRSA, PCR: NEGATIVE
Staphylococcus aureus: NEGATIVE

## 2012-05-31 LAB — POCT I-STAT TROPONIN I: Troponin i, poc: 0 ng/mL (ref 0.00–0.08)

## 2012-05-31 SURGERY — ERCP, WITH INTERVENTION IF INDICATED
Anesthesia: Moderate Sedation

## 2012-05-31 MED ORDER — MIDAZOLAM HCL 10 MG/2ML IJ SOLN
INTRAMUSCULAR | Status: DC | PRN
  Administered 2012-05-31 (×2): 2.5 mg via INTRAVENOUS

## 2012-05-31 MED ORDER — BUTAMBEN-TETRACAINE-BENZOCAINE 2-2-14 % EX AERO
INHALATION_SPRAY | CUTANEOUS | Status: AC
  Filled 2012-05-31: qty 56

## 2012-05-31 MED ORDER — FENTANYL CITRATE 0.05 MG/ML IJ SOLN
INTRAMUSCULAR | Status: DC | PRN
  Administered 2012-05-31 (×2): 25 ug via INTRAVENOUS

## 2012-05-31 MED ORDER — BUTAMBEN-TETRACAINE-BENZOCAINE 2-2-14 % EX AERO
INHALATION_SPRAY | CUTANEOUS | Status: DC | PRN
  Administered 2012-05-31: 2 via TOPICAL

## 2012-05-31 MED ORDER — SODIUM CHLORIDE 0.9 % IV SOLN
INTRAVENOUS | Status: DC | PRN
  Administered 2012-05-31: 14:00:00

## 2012-05-31 MED ORDER — FENTANYL CITRATE 0.05 MG/ML IJ SOLN
INTRAMUSCULAR | Status: AC
  Filled 2012-05-31: qty 4

## 2012-05-31 MED ORDER — MIDAZOLAM HCL 5 MG/ML IJ SOLN
INTRAMUSCULAR | Status: AC
  Filled 2012-05-31: qty 4

## 2012-05-31 MED ORDER — GLUCAGON HCL (RDNA) 1 MG IJ SOLR
INTRAMUSCULAR | Status: DC | PRN
  Administered 2012-05-31: .25 mg via INTRAVENOUS

## 2012-05-31 MED ORDER — GLUCAGON HCL (RDNA) 1 MG IJ SOLR
INTRAMUSCULAR | Status: AC
  Filled 2012-05-31: qty 2

## 2012-05-31 NOTE — Op Note (Signed)
[facility] [address] [place], [address]   ERCP PROCEDURE REPORT  PATIENT: Beth May, Beth May  MR# :[id number] BIRTHDATE: [birth year]  GENDER: Female ENDOSCOPIST: Meryl Dare, MD, [facility] REFERRED BY: [facility] PROCEDURE DATE:  05/31/2012 PROCEDURE:   ERCP with sphincterotomy/papillotomy and with removal of calculus/calculi ASA CLASS:   Class III INDICATIONS:  abnormal abdominal ultrasound.   abnormal liver function test .   suspected bile duct stones. MEDICATIONS: MAC sedation, administered by CRNA, Fentanyl 50 mcg IV, Versed 5 mg IV, and Glucagon .25 mg IV TOPICAL ANESTHETIC: Cetacaine Spray DESCRIPTION OF PROCEDURE:   After the risks benefits and alternatives of the procedure were thoroughly explained, informed consent was obtained.  The    Pentax duodenoscope was introduced through the mouth  and advanced to the second portion of the duodenum . 1.  The ampulla was located in the second portion of the duodenum. The ampulla appeared prominent but otherwise normal. With guidewire in the bile duct, a biliary sphincterotomy was performed using the sphincterotome. The ampulla was normal after pus drained. 2.  Two stones were seen in the distal common bile duct with pus draining.  The rest of the bilary tree appeared normal and contrast filled the cystic duct. The PD was not cannulated or injected by intention. 3.  Using a stone extraction balloon the bile duct was swept several times.  Two stones were removed from the bile duct successfully. Excellent bilary drainage and air refluxed into the biliary tree. The scope was then completely withdrawn from the patient and the procedure completed.    COMPLICATIONS:  There were no immediate complications.  ENDOSCOPIC IMPRESSION: 1.   Biliary sphincterotomy performed 2.   Two stones removed from the common bile duct 3.   Cholangitis  RECOMMENDATIONS: 1.  follow up liver enzymes 2.   antibiotics  eSigned:  Meryl Dare, MD, [facility] 05/31/2012 2:01 PM   [[place]bon Copy]

## 2012-05-31 NOTE — Progress Notes (Signed)
   Subjective:  No chest pain or dyspnea. Awaiting ERCP today  Objective:  Vital Signs in the last 24 hours: Temp:  [98.2 F (36.8 C)-99.7 F (37.6 C)] 98.7 F (37.1 C) (09/25 0600) Pulse Rate:  [61-70] 65  (09/25 0600) Resp:  [19-20] 20  (09/25 0600) BP: (132-144)/(64-82) 135/74 mmHg (09/25 0600) SpO2:  [97 %-98 %] 98 % (09/25 0600)  Intake/Output from previous day: 09/24 0701 - 09/25 0700 In: 1967.5 [P.O.:480; I.V.:1487.5] Out: -  Intake/Output from this shift:       . ciprofloxacin  400 mg Intravenous BID  . influenza  inactive virus vaccine  0.5 mL Intramuscular Tomorrow-1000  . metoprolol  5 mg Intravenous QID  . metronidazole  500 mg Intravenous Q8H  . sodium chloride  3 mL Intravenous Q12H      . sodium chloride 75 mL/hr at 05/31/12 0021  . heparin Stopped (05/31/12 0657)    Physical Exam: The patient appears to be in no distress.  Head and neck exam reveals that the pupils are equal and reactive.  The extraocular movements are full.  There is no scleral icterus.  Mouth and pharynx are benign.  No lymphadenopathy.  No carotid bruits.  The jugular venous pressure is normal.  Thyroid is not enlarged or tender.  Chest is clear to percussion and auscultation.  No rales or rhonchi.  Expansion of the chest is symmetrical.  Heart reveals no abnormal lift or heave.  First and second heart sounds are normal.  There is no  gallop rub or click. Gr 2/6 systolic murmur at base.  The abdomen is soft and mild RUQ tenderness.  Bowel sounds are normoactive.  There is no hepatosplenomegaly or mass.  There are no abdominal bruits.  Extremities reveal no phlebitis or edema.  Pedal pulses are good.  There is no cyanosis or clubbing.  Neurologic exam is normal strength and no lateralizing weakness.  No sensory deficits.  Integument reveals no rash  Lab Results:  Basename 05/31/12 0500 05/30/12 0450  WBC 11.4* 11.5*  HGB 12.3 13.4  PLT 348 434*    Basename 05/31/12 0500  05/30/12 0450  NA 137 140  K 3.4* 3.7  CL 102 106  CO2 23 22  GLUCOSE 135* 116*  BUN 5* 6  CREATININE 0.69 0.75    Basename 05/29/12 1516 05/29/12 1012  TROPONINI <0.30 <0.30   Hepatic Function Panel  Basename 05/31/12 0500 05/29/12 2335  PROT 6.6 --  ALBUMIN 3.1* --  AST 171* --  ALT 277* --  ALKPHOS 259* --  BILITOT 8.5* --  BILIDIR -- 6.7*  IBILI -- 1.6*   No results found for this basename: CHOL in the last 72 hours No results found for this basename: PROTIME in the last 72 hours  Imaging: Imaging results have been reviewed  Cardiac Studies: Telemetry reviewed. Shows AV pacing. Assessment/Plan:    Atrial fibrillation maintaining V-paced rhythm (05/29/2012)   Assessment: After ERCP restart warfarin     LOS: 3 days    Cassell Clement 05/31/2012, 8:32 AM

## 2012-05-31 NOTE — Progress Notes (Signed)
Heparin is off, I verified this at bedside myself.  Pt without much abd pain, no nausea.   Vitals and labs reviewed:  No fever Pt looks well.    Assessment:  * Symptomatic cholelithiasis, chronic cholecystitis. Pt not toxic. On empiric Cipro and Flagyl. Plans cholecystectomy back home in [place]. * Elevated LFTs and 8 mm CBD on ultrasound. R/O Choledocholithiasis. LFTs improved.  * Chronic coumadin, hx A fib. INR in decline. Got single dose of IV Vit K. Heparin discontinued for now. .  * S/P ICD/pacemaker. Hx class 3 CHF, now with normal LV fx.  * Hyperglycemia.   Plan:   ERCP today.    I have taken an interval history, reviewed the chart and examined the patient. I agree with the extender's note, impression and recommendations. ERCP/sphincterotomy today as planned.  Venita Lick. Russella Dar MD Clementeen Graham

## 2012-05-31 NOTE — Progress Notes (Signed)
PATIENT DETAILS Name: Beth May Age: 56 y.o. Sex: female Date of Birth: Jan 08, 1956 Admit Date: 05/28/2012 Admitting Physician Eduard Clos, MD PCP:No primary provider on file.  Subjective: No major issues overnight  Assessment/Plan: Principal Problem:  Obstructive Jaundice -2/2 choledocholithiasis -s/p ERCP with stone removal -will need to d/w CCS-if better to do cholecystectomy this admission-given findings of cholangitis on ERCP -follow LFT's  Active Problems: Acute Cholangitis -pus drained during ERCP-looks like it was not sent for c/s -c/w Cipro/Flagyl  Afib -paced rhythm -EF-55-60% -coumadin on hold-will need to restart warfarin when ok with GI and if CCS-if they are not doing Cholecystectomy this admit -on IV lopressor  HTN -controlled with IV lopressor  Tachy-Brady Syndrome -PPM in place -cards following  Ovarian Cyst -25 mm left ovarian cystic lesion seen on CT Abd/Pelvis - Follow-up 8-week pelvic ultrasound recommended-will need to be done as outpatient   Disposition: Remain inpatient  DVT Prophylaxis: SCD's  Code Status: Full code   Procedures:  ERCP 9/25  CONSULTS:  cardiology, GI and general surgery  PHYSICAL EXAM: Vital signs in last 24 hours: Filed Vitals:   05/31/12 1359 05/31/12 1400 05/31/12 1410 05/31/12 1420  BP: 153/92 153/92 143/84 141/86  Pulse: 89     Temp:      TempSrc:      Resp: 24 24 25 20   Height:      Weight:      SpO2: 98% 98% 99% 99%    Weight change:  Body mass index is 29.78 kg/(m^2).   Gen Exam: Awake and alert with clear speech.   Neck: Supple, No JVD.   Chest: B/L Clear.   CVS: S1 S2 Regular, no murmurs.  Abdomen: soft, BS +, non tender, non distended.  Extremities: no edema, lower extremities warm to touch. Neurologic: Non Focal.  Skin: No Rash.   Wounds: N/A.    Intake/Output from previous day:  Intake/Output Summary (Last 24 hours) at 05/31/12 1441 Last data filed at 05/31/12 1006  Gross per 24 hour  Intake  881.5 ml  Output      0 ml  Net  881.5 ml     LAB RESULTS: CBC  Lab 05/31/12 0500 05/30/12 0450 05/29/12 0534 05/28/12 1423  WBC 11.4* 11.5* 9.5 10.5  HGB 12.3 13.4 13.3 14.0  HCT 36.5 39.6 39.3 40.6  PLT 348 434* 399 437*  MCV 88.2 87.6 87.1 86.8  MCH 29.7 29.6 29.5 29.9  MCHC 33.7 33.8 33.8 34.5  RDW 13.7 13.5 13.3 13.1  LYMPHSABS -- -- 2.1 2.3  MONOABS -- -- 0.8 0.9  EOSABS -- -- 1.5* 1.8*  BASOSABS -- -- 0.1 0.0  BANDABS -- -- -- --    Chemistries   Lab 05/31/12 0500 05/30/12 0450 05/29/12 0534 05/28/12 1423  NA 137 140 138 135  K 3.4* 3.7 3.7 3.8  CL 102 106 102 99  CO2 23 22 24 25   GLUCOSE 135* 116* 88 116*  BUN 5* 6 8 9   CREATININE 0.69 0.75 0.69 0.73  CALCIUM 9.2 9.2 9.4 9.7  MG -- -- -- --    CBG:  Lab 05/30/12 0800 05/30/12 0443 05/29/12 2330 05/29/12 1925 05/29/12 1146  GLUCAP 108* 108* 117* 153* 97    GFR Estimated Creatinine Clearance: 85.7 ml/min (by C-G formula based on Cr of 0.69).  Coagulation profile  Lab 05/30/12 0450 05/29/12 0534 05/29/12 0054  INR 1.18 1.89* 2.87*  PROTIME -- -- --    Cardiac Enzymes  Lab 05/29/12 1516 05/29/12  1012 05/29/12 0534  CKMB -- -- --  TROPONINI <0.30 <0.30 <0.30  MYOGLOBIN -- -- --    No components found with this basename: POCBNP:3 No results found for this basename: DDIMER:2 in the last 72 hours No results found for this basename: HGBA1C:2 in the last 72 hours No results found for this basename: CHOL:2,HDL:2,LDLCALC:2,TRIG:2,CHOLHDL:2,LDLDIRECT:2 in the last 72 hours No results found for this basename: TSH,T4TOTAL,FREET3,T3FREE,THYROIDAB in the last 72 hours No results found for this basename: VITAMINB12:2,FOLATE:2,FERRITIN:2,TIBC:2,IRON:2,RETICCTPCT:2 in the last 72 hours  Basename 05/30/12 0450  LIPASE 35  AMYLASE --    Urine Studies No results found for this basename:  UACOL:2,UAPR:2,USPG:2,UPH:2,UTP:2,UGL:2,UKET:2,UBIL:2,UHGB:2,UNIT:2,UROB:2,ULEU:2,UEPI:2,UWBC:2,URBC:2,UBAC:2,CAST:2,CRYS:2,UCOM:2,BILUA:2 in the last 72 hours  MICROBIOLOGY: Recent Results (from the past 240 hour(s))  MRSA PCR SCREENING     Status: Normal   Collection Time   05/29/12  3:39 AM      Component Value Range Status Comment   MRSA by PCR NEGATIVE  NEGATIVE Final   SURGICAL PCR SCREEN     Status: Normal   Collection Time   05/31/12  3:43 AM      Component Value Range Status Comment   MRSA, PCR NEGATIVE  NEGATIVE Final    Staphylococcus aureus NEGATIVE  NEGATIVE Final     RADIOLOGY STUDIES/RESULTS: Dg Chest 2 View  05/28/2012  *RADIOLOGY REPORT*  Clinical Data: 56 year old female abdominal pain, back pain, jaundice.  CHEST - 2 VIEW  Comparison: None.  Findings: Left chest cardiac AICD.  Somewhat low lung volumes. Cardiac size and mediastinal contours are within normal limits. Visualized tracheal air column is within normal limits.  No pneumothorax, pulmonary edema or pleural effusion.  No pneumoperitoneum.  Mild scoliosis. No acute osseous abnormality identified.  IMPRESSION: Low lung volumes, otherwise no acute cardiopulmonary abnormality.   Original Report Authenticated By: Harley Hallmark, M.D.    US Abdomen Complete  05/29/2012  *RADIOLOGY REPORT*  Clinical Data:  Abdominal pain and back pain.  COMPLETE ABDOMINAL ULTRASOUND  Comparison:  CT 05/28/2012.  Findings:  Gallbladder:  Multiple gallstones are present with a large amount of biliary sludge.  Thickened wall is present although there is no sonographic Murphy's sign.  Gallbladder wall measures up to 5 mm. The largest stone measures 16 mm.  Common bile duct:  Dilated for age measuring up to 8 mm.  No common duct stone identified.  Liver:  No focal lesion identified.  Within normal limits in parenchymal echogenicity.  IVC:  Appears normal.  Pancreas:  No focal abnormality seen.  Spleen:  6.4 cm.  Normal echotexture.  Right Kidney:   10.7 cm. Normal echotexture.  Normal central sinus echo complex.  No calculi or hydronephrosis.  Left Kidney:  9.8 cm. Normal echotexture.  Normal central sinus echo complex.  No calculi or hydronephrosis.  Abdominal aorta:  No aneurysm identified.  IMPRESSION: 1.  Cholelithiasis with gallbladder wall thickening.  The absence of the sonographic Murphy's sign is atypical and suggests chronic cholecystitis however it is difficult to completely exclude acute cholecystitis. 2.  Dilation of the common bile duct without common duct stone identified.  If cholecystectomy performed, intraoperative cholangiogram recommended.   Original Report Authenticated By: Andreas Newport, M.D.    Ct Abdomen Pelvis W Contrast  05/28/2012  *RADIOLOGY REPORT*  Clinical Data: Epigastric pain.  Pain radiating to the back.  Poor appetite.  Nausea.  No vomiting.  CT ABDOMEN AND PELVIS WITH CONTRAST  Technique:  Multidetector CT imaging of the abdomen and pelvis was performed following the standard protocol during  bolus administration of intravenous contrast.  Contrast:  100 ml Omnipaque-300.  Comparison: None.  Findings: Lung Bases: Dependent atelectasis.  No airspace disease.        Liver:  Mild intrahepatic biliary ductal dilation.  Correlation with liver function studies and bilirubin recommended.  No mass lesion.  Portal vein appears normal.  Spleen:  Normal.  Gallbladder:  Hydropic with probable wall thickening and pericholecystic stranding.   Small amount of pericholecystic fluid suggest acute cholecystitis.  Common bile duct:  No common duct stone identified. Normal diameter.  Pancreas:  Normal.  Adrenal glands:  Normal.  Kidneys:  Normal enhancement and excretion.  Stomach:  Small hiatal hernia.  No inflammatory changes.  Small bowel:  No mesenteric adenopathy.  No obstruction.  Colon:   Normal appendix.  No inflammatory changes of colon.  Pelvic Genitourinary:  9 cm x 7.5 cm intrauterine heterogeneous low density lesion most  compatible with a large uterine fibroid.  No free fluid.  Left ovarian cystic lesion, likely cyst.  Bones:  No aggressive osseous lesions.  Levoconvex curvature of the lumbar spine.  L4-L5 predominant degenerative disc disease.  The pacemaker partially visualized.  Vasculature: Normal.  IMPRESSION:  1.  Constellation of findings suggesting acute cholecystitis with hydropic gallbladder, probable wall thickening and pericholecystic fluid.  Mild intrahepatic biliary ductal dilation.   No calcified cyst stones are identified in the gallbladder or common bile duct. 2.  Fibroid uterus. 3.  25 mm left ovarian cystic lesion.  Follow-up 8-week pelvic ultrasound recommended to assess for resolution.   Original Report Authenticated By: Andreas Newport, M.D.     MEDICATIONS: Scheduled Meds:   . ciprofloxacin  400 mg Intravenous BID  . metoprolol  5 mg Intravenous QID  . metronidazole  500 mg Intravenous Q8H  . sodium chloride  3 mL Intravenous Q12H   Continuous Infusions:   . sodium chloride 75 mL/hr at 05/31/12 0021  . heparin Stopped (05/31/12 0657)   PRN Meds:.acetaminophen, acetaminophen, HYDROmorphone (DILAUDID) injection, ondansetron (ZOFRAN) IV, ondansetron, DISCONTD: butamben-tetracaine-benzocaine, DISCONTD: fentaNYL, DISCONTD: glucagon, DISCONTD: midazolam, DISCONTD: Omnipaque 350 mg/mL (50 mL) in 0.9% normal saline (50 mL)  Antibiotics: Anti-infectives     Start     Dose/Rate Route Frequency Ordered Stop   05/29/12 0400   metroNIDAZOLE (FLAGYL) IVPB 500 mg        500 mg 100 mL/hr over 60 Minutes Intravenous 3 times per day 05/29/12 0337     05/29/12 0400   ciprofloxacin (CIPRO) IVPB 400 mg        400 mg 200 mL/hr over 60 Minutes Intravenous 2 times daily 05/29/12 0341            Jeoffrey Massed, MD  [facility] Pager:[phone]  If 7PM-7AM, please contact night-coverage [email or web address] Password [email or web address] 05/31/2012, 2:41 PM   LOS: 3 days

## 2012-05-31 NOTE — Progress Notes (Signed)
Pt returned from Endo procedure. Spoke with GI. Heparin drip is to be held until tomorrow. MD notified. MD to put orders in for [facility]. Will continue to monitor. - Christell Faith, RN

## 2012-05-31 NOTE — Interval H&P Note (Signed)
History and Physical Interval Note:  05/31/2012 1:26 PM  Beth May  has presented today for surgery, with the diagnosis of jaundice.  gallstones.  The various methods of treatment have been discussed with the patient and family. After consideration of risks, benefits and other options for treatment, the patient has consented to  Procedure(s) (LRB) with comments: ENDOSCOPIC RETROGRADE CHOLANGIOPANCREATOGRAPHY (ERCP) (N/A) - hep needs to be off X 6hrs, should be 630a as a surgical intervention .  The patient's history has been reviewed, patient examined, no change in status, stable for surgery.  I have reviewed the patient's chart and labs.  Questions were answered to the patient's satisfaction.     Venita Lick. Russella Dar MD Clementeen Graham

## 2012-05-31 NOTE — OR Nursing (Signed)
Pacer magnet applied to left chest for sphincerotomy and removed when completed.

## 2012-05-31 NOTE — Progress Notes (Signed)
Patient ID: Beth May, female   DOB: October 16, 1955, 56 y.o.   MRN: [id number]    Subjective: No abdominal pain at current, English not first language so difficult to communicate.  Objective: Vital signs in last 24 hours: Temp:  [98.2 F (36.8 C)-99.7 F (37.6 C)] 98.7 F (37.1 C) (09/25 0600) Pulse Rate:  [61-70] 65  (09/25 0600) Resp:  [19-20] 20  (09/25 0600) BP: (132-144)/(64-82) 135/74 mmHg (09/25 0600) SpO2:  [97 %-98 %] 98 % (09/25 0600) Last BM Date: 05/30/12  Intake/Output from previous day: 09/24 0701 - 09/25 0700 In: 1967.5 [P.O.:480; I.V.:1487.5] Out: -  Intake/Output this shift:   Physical Exam: General appearance: alert, cooperative and no distress Resp: clear to auscultation bilaterally Cardio: regular rate and rhythm GI: soft, mildly tender, +BS  Lab Results:   Basename 05/31/12 0500 05/30/12 0450  WBC 11.4* 11.5*  HGB 12.3 13.4  HCT 36.5 39.6  PLT 348 434*   BMET  Basename 05/31/12 0500 05/30/12 0450  NA 137 140  K 3.4* 3.7  CL 102 106  CO2 23 22  GLUCOSE 135* 116*  BUN 5* 6  CREATININE 0.69 0.75  CALCIUM 9.2 9.2   PT/INR  Basename 05/30/12 0450 05/29/12 0534  LABPROT 14.8 21.0*  INR 1.18 1.89*   ABG No results found for this basename: PHART:2,PCO2:2,PO2:2,HCO3:2 in the last 72 hours  Studies/Results: No results found.  Anti-infectives: Anti-infectives     Start     Dose/Rate Route Frequency Ordered Stop   05/29/12 0400   metroNIDAZOLE (FLAGYL) IVPB 500 mg        500 mg 100 mL/hr over 60 Minutes Intravenous 3 times per day 05/29/12 0337     05/29/12 0400   ciprofloxacin (CIPRO) IVPB 400 mg        400 mg 200 mL/hr over 60 Minutes Intravenous 2 times daily 05/29/12 0341            Assessment/Plan: s/p Procedure(s) (LRB) with comments: ENDOSCOPIC RETROGRADE CHOLANGIOPANCREATOGRAPHY (ERCP) (N/A) - plan for today per GI  Patient has been cleared by cardiology for ERCP which will be done today.  Bilirubin 8.5 today.  After ERCP  will need cholecystectomy, will plan on sending home on ABX for week to 10 days; with f/u in 2-3 months time for an elective cholecystectomy then.    LOS: 3 days    WHITE, ELIZABETH 05/31/2012

## 2012-06-01 ENCOUNTER — Encounter (HOSPITAL_COMMUNITY): Payer: Self-pay | Admitting: Gastroenterology

## 2012-06-01 ENCOUNTER — Encounter (HOSPITAL_COMMUNITY): Payer: Self-pay

## 2012-06-01 DIAGNOSIS — K819 Cholecystitis, unspecified: Secondary | ICD-10-CM

## 2012-06-01 LAB — HEPATIC FUNCTION PANEL
ALT: 291 U/L — ABNORMAL HIGH (ref 0–35)
AST: 245 U/L — ABNORMAL HIGH (ref 0–37)
Alkaline Phosphatase: 245 U/L — ABNORMAL HIGH (ref 39–117)
Bilirubin, Direct: 5 mg/dL — ABNORMAL HIGH (ref 0.0–0.3)

## 2012-06-01 LAB — CBC
HCT: 37.5 % (ref 36.0–46.0)
Hemoglobin: 12.6 g/dL (ref 12.0–15.0)
MCH: 29.7 pg (ref 26.0–34.0)
MCHC: 33.6 g/dL (ref 30.0–36.0)
MCV: 88.4 fL (ref 78.0–100.0)
RBC: 4.24 MIL/uL (ref 3.87–5.11)

## 2012-06-01 LAB — BASIC METABOLIC PANEL
BUN: 9 mg/dL (ref 6–23)
Calcium: 9.1 mg/dL (ref 8.4–10.5)
Chloride: 103 mEq/L (ref 96–112)
Creatinine, Ser: 0.74 mg/dL (ref 0.50–1.10)
GFR calc Af Amer: >90 mL/min (ref >90)

## 2012-06-01 LAB — LIPASE, BLOOD: Lipase: 35 U/L (ref 11–59)

## 2012-06-01 MED ORDER — PANTOPRAZOLE SODIUM 40 MG PO TBEC
40.0000 mg | DELAYED_RELEASE_TABLET | Freq: Every day | ORAL | Status: DC
  Administered 2012-06-01 – 2012-06-02 (×2): 40 mg via ORAL
  Filled 2012-06-01 (×2): qty 1

## 2012-06-01 MED ORDER — HEPARIN (PORCINE) IN NACL 100-0.45 UNIT/ML-% IJ SOLN
1100.0000 [IU]/h | INTRAMUSCULAR | Status: DC
  Filled 2012-06-01: qty 250

## 2012-06-01 MED ORDER — WARFARIN - PHARMACIST DOSING INPATIENT: Freq: Every day | Status: DC

## 2012-06-01 MED ORDER — WARFARIN SODIUM 5 MG PO TABS
5.0000 mg | ORAL_TABLET | Freq: Once | ORAL | Status: DC
  Filled 2012-06-01: qty 1

## 2012-06-01 MED ORDER — CARVEDILOL 12.5 MG PO TABS
12.5000 mg | ORAL_TABLET | Freq: Two times a day (BID) | ORAL | Status: DC
  Filled 2012-06-01 (×2): qty 1

## 2012-06-01 MED ORDER — ASPIRIN 81 MG PO CHEW
81.0000 mg | CHEWABLE_TABLET | Freq: Every day | ORAL | Status: DC
  Administered 2012-06-01 – 2012-06-02 (×2): 81 mg via ORAL
  Filled 2012-06-01 (×2): qty 1

## 2012-06-01 NOTE — Progress Notes (Signed)
Patient ID: Beth May, female   DOB: 1956-01-18, 56 y.o.   MRN: [id number] 1 Day Post-Op  Subjective: Feels better then yesterday, back pain resolved, denies n/v.    Objective: Vital signs in last 24 hours: Temp:  [97.7 F (36.5 C)-98.7 F (37.1 C)] 97.7 F (36.5 C) (09/26 0651) Pulse Rate:  [60-89] 60  (09/26 0651) Resp:  [10-25] 20  (09/26 0651) BP: (124-161)/(73-92) 148/87 mmHg (09/26 0651) SpO2:  [97 %-100 %] 98 % (09/26 0651) Weight:  [187 lb 13.3 oz (85.2 kg)] 187 lb 13.3 oz (85.2 kg) (09/26 0651) Last BM Date: 05/31/12  Intake/Output from previous day: 09/25 0701 - 09/26 0700 In: 3 [I.V.:3] Out: -  Intake/Output this shift:   Physical Exam: General appearance: alert, cooperative and no distress Resp: clear to auscultation bilaterally Cardio: regular rate and rhythm GI: soft, mildly tender, +BS  Lab Results:   Basename 05/31/12 0500 05/30/12 0450  WBC 11.4* 11.5*  HGB 12.3 13.4  HCT 36.5 39.6  PLT 348 434*   BMET  Basename 05/31/12 0500 05/30/12 0450  NA 137 140  K 3.4* 3.7  CL 102 106  CO2 23 22  GLUCOSE 135* 116*  BUN 5* 6  CREATININE 0.69 0.75  CALCIUM 9.2 9.2   PT/INR  Basename 05/30/12 0450  LABPROT 14.8  INR 1.18   ABG No results found for this basename: PHART:2,PCO2:2,PO2:2,HCO3:2 in the last 72 hours  Studies/Results: Dg C-arm 1-60 Min-no Report  05/31/2012  CLINICAL DATA: common bile duct stones   C-ARM 1-60 MINUTES  Fluoroscopy was utilized by the requesting physician.  No radiographic  interpretation.      Anti-infectives: Anti-infectives     Start     Dose/Rate Route Frequency Ordered Stop   05/29/12 0400   metroNIDAZOLE (FLAGYL) IVPB 500 mg        500 mg 100 mL/hr over 60 Minutes Intravenous 3 times per day 05/29/12 0337     05/29/12 0400   ciprofloxacin (CIPRO) IVPB 400 mg        400 mg 200 mL/hr over 60 Minutes Intravenous 2 times daily 05/29/12 0341            Assessment/Plan: s/p Procedure(s) (LRB) with  comments: ENDOSCOPIC RETROGRADE CHOLANGIOPANCREATOGRAPHY (ERCP) (N/A)  1.  Symptomatic cholelithiasis with acute on chronic cholecystitis: s/p ERCP with stone extraction and sphincterotomy, restarting heparin and need to watch for bleeding with procedure.  Will need cholecystectomy which the patient wants to have done in [place], will need to be bridge with anticoagulation once discharged from here and needs immediate follow up in [place] with a surgeon.  Will send home on antibiotics and recommend a liquid diet.  Discharge per Medical and GI service.     LOS: 4 days    WHITE, ELIZABETH 06/01/2012

## 2012-06-01 NOTE — Progress Notes (Signed)
In formed by NSMT that pt. HR ratet went down to 29 on the monitor.  Pt. Resting in bed talking to a visitor, finishing her lunch.  VSS - Blood pressure 140/68, pulse 67, temperature 98.6 F (37 C), temperature source Oral, resp. rate 20, height 5\' 6"  (1.676 m), weight 85.2 kg (187 lb 13.3 oz), SpO2 98.00%. R/A.  Dr. Jerral Ralph made aware, came in to see pt. And called cardiology to see.  Will continue to monitor.  Forbes Cellar, RN

## 2012-06-01 NOTE — Progress Notes (Signed)
ANTICOAGULATION CONSULT NOTE - Follow Up Consult  Pharmacy Consult for Heparin/Coumadin Indication: h/o afib  No Known Allergies  Patient Measurements: Height: 5\' 6"  (167.6 cm) Weight: 187 lb 13.3 oz (85.2 kg) IBW/kg (Calculated) : 59.3  Heparin Dosing Weight: 77 kg  Vital Signs: Temp: 97.7 F (36.5 C) (09/26 0651) Temp src: Oral (09/26 0651) BP: 148/87 mmHg (09/26 0651) Pulse Rate: 60  (09/26 0651)  Labs:  Basename 05/31/12 0500 05/30/12 1238 05/30/12 0450 05/29/12 1516 05/29/12 1012  HGB 12.3 -- 13.4 -- --  HCT 36.5 -- 39.6 -- --  PLT 348 -- 434* -- --  APTT -- -- -- -- --  LABPROT -- -- 14.8 -- --  INR -- -- 1.18 -- --  HEPARINUNFRC 0.14* 0.67 0.87* -- --  CREATININE 0.69 -- 0.75 -- --  CKTOTAL -- -- -- -- --  CKMB -- -- -- -- --  TROPONINI -- -- -- <0.30 <0.30    Estimated Creatinine Clearance: 86.4 ml/min (by C-G formula based on Cr of 0.69).   Medications:  Scheduled:     . ciprofloxacin  400 mg Intravenous BID  . metoprolol  5 mg Intravenous QID  . metronidazole  500 mg Intravenous Q8H  . sodium chloride  3 mL Intravenous Q12H    Assessment: 56 yo female admitted for cholecystitis, s/p ERCP to resume coumadin/ heparin for afib. CBC stable. No bleeding noted.  Goal of Therapy:  Heparin level 0.3-0.7 units/ml Monitor platelets by anticoagulation protocol: Yes INR 2-3   Plan:  -Resume heparin 1100 units/hr -Coumadin  5 mg today. -Check heparin level 6 hours after restart. Daily CBC, heparin level and INR. Monitor for s&s bleeding. Woodfin Ganja, PharmD 06/01/2012,9:35 AM

## 2012-06-01 NOTE — Progress Notes (Signed)
Heart Rate very transiently went down to the 20's-but rebounded quickly, hold coreg for now, spoke with [facility] cards-they will follow.Patient assymptomatic.

## 2012-06-01 NOTE — Progress Notes (Signed)
     [facility] Daily Rounding Note 06/01/2012, 8:42 AM  SUBJECTIVE:       No abd pain or nausea.  Feels ok. No complaints.  Still on clears  OBJECTIVE:         Vital signs in last 24 hours:    Temp:  [97.7 F (36.5 C)-98.7 F (37.1 C)] 97.7 F (36.5 C) (09/26 0651) Pulse Rate:  [60-89] 60  (09/26 0651) Resp:  [10-25] 20  (09/26 0651) BP: (124-161)/(73-92) 148/87 mmHg (09/26 0651) SpO2:  [97 %-100 %] 98 % (09/26 0651) Weight:  [187 lb 13.3 oz (85.2 kg)] 187 lb 13.3 oz (85.2 kg) (09/26 0651) Last BM Date: 05/31/12 General: looks well.   Heart: RRR Chest: clear B Abdomen: soft, active BS, NT, ND  Extremities: no pedal edema Neuro/Psych:  No confusion or agitation.  Appropriate.   Intake/Output from previous day: 09/25 0701 - 09/26 0700 In: 3 [I.V.:3] Out: -    Lab Results:  Basename 05/31/12 0500 05/30/12 0450  WBC 11.4* 11.5*  HGB 12.3 13.4  HCT 36.5 39.6  PLT 348 434*   BMET  Basename 05/31/12 0500 05/30/12 0450  NA 137 140  K 3.4* 3.7  CL 102 106  CO2 23 22  GLUCOSE 135* 116*  BUN 5* 6  CREATININE 0.69 0.75  CALCIUM 9.2 9.2   LFT  Basename 05/31/12 0500 05/30/12 0450 05/29/12 2335  PROT 6.6 7.0 6.8  ALBUMIN 3.1* 3.3* 3.3*  AST 171* 175* 170*  ALT 277* 306* 311*  ALKPHOS 259* 282* 268*  BILITOT 8.5* 8.8* 8.3*  BILIDIR -- -- 6.7*  IBILI -- -- 1.6*    ASSESMENT: * Symptomatic cholelithiasis, chronic cholecystitis, Cholangitis, choledocholithiasis. Pt not toxic.  S/P 9/25 ERCP with sphincterotomy and stone removal from CBD.  LFTs improved.  No evidence for post ERCP sphincterotomy.  On Cipro and Flagyl. Plans cholecystectomy back home in [place].  * Chronic coumadin, hx A fib. To restart heparin today.  Need to watch for post sphincterotomy bleeding.  * S/P ICD/pacemaker. Hx class 3 CHF, now with normal LV fx.  * Hyperglycemia.    PLAN: *  At discharge can go home on Cipro/Flagyl po for one week duration starting today.  She is planning to go  back to [place] on 9/28. *  Has appt with her primary MD in [place] early next week who hopefully can get her in to see a surgeon soon *  Stay off Coumadin for 7 days starting 9/25.  This includes no Heparin, but ok to start 81 mg ASA *  I advised pt to look out for bloody or back stools.  If seen, she needs to proceed to closest ED for possible post shincterotomy bleeding.  *  Will sign off.    LOS: 4 days   Jennye Moccasin  06/01/2012, 8:42 AM Pager: [phone]   I have taken an interval history, reviewed the chart and examined the patient. I agree with the extender's note, impression and recommendations. OK for discharge tomorrow from GI standpoint. We will sign off.  Venita Lick. Russella Dar MD Clementeen Graham

## 2012-06-01 NOTE — Progress Notes (Addendum)
PATIENT DETAILS Name: Beth May Age: 56 y.o. Sex: female Date of Birth: [birth year] Admit Date: 05/28/2012 Admitting Physician Eduard Clos, MD PCP:No primary provider on file.  Subjective: No major issues overnight-no abd pain today  Assessment/Plan: Principal Problem:  Obstructive Jaundice -2/2 choledocholithiasis -s/p ERCP with stone removal - d/w CCS-Dr Martin-he does not advise inpatient cholecystectomy-advises cholecystectomy in the next few weeks.Patient keen to do cholecytectomy in [place]. Called [facility]-they will call be back with a follow up appt for the patient. 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 -cards following  Ovarian Cyst -25 mm left ovarian cystic lesion seen on CT Abd/Pelvis - Follow-up 8-week pelvic ultrasound recommended-will need to be done as outpatient   Disposition: Remain inpatient  DVT Prophylaxis: SCD's  Code Status: Full code   Procedures:  ERCP 9/25  CONSULTS:  cardiology, GI and general surgery  PHYSICAL EXAM: Vital signs in last 24 hours: Filed Vitals:   05/31/12 2157 06/01/12 0005 06/01/12 0438 06/01/12 0651  BP: 139/83 125/78 124/77 148/87  Pulse: 62 71 62 60  Temp: 98.3 F (36.8 C)   97.7 F (36.5 C)  TempSrc: Oral   Oral  Resp: 20   20  Height:      Weight:    85.2 kg (187 lb 13.3 oz)  SpO2: 97%   98%    Weight change:  Body mass index is 30.32 kg/(m^2).   Gen Exam: Awake and alert with clear speech.   Neck: Supple, No JVD.   Chest: B/L Clear.   CVS: S1 S2 Regular, no murmurs.    Abdomen: soft, BS +, non tender, non distended.  Extremities: no edema, lower extremities warm to touch. Neurologic: Non Focal.  Skin: No Rash.   Wounds: N/A.    Intake/Output from previous day: No intake or output data in the 24 hours ending 06/01/12 1140   LAB RESULTS: CBC  Lab 06/01/12 0845 05/31/12 0500 05/30/12 0450 05/29/12 0534 05/28/12 1423  WBC 7.7 11.4* 11.5* 9.5 10.5  HGB 12.6 12.3 13.4 13.3 14.0  HCT 37.5 36.5 39.6 39.3 40.6  PLT 366 348 434* 399 437*  MCV 88.4 88.2 87.6 87.1 86.8  MCH 29.7 29.7 29.6 29.5 29.9  MCHC 33.6 33.7 33.8 33.8 34.5  RDW 13.8 13.7 13.5 13.3 13.1  LYMPHSABS -- -- -- 2.1 2.3  MONOABS -- -- -- 0.8 0.9  EOSABS -- -- -- 1.5* 1.8*  BASOSABS -- -- -- 0.1 0.0  BANDABS -- -- -- -- --    Chemistries   Lab 06/01/12 0845 05/31/12 0500 05/30/12 0450 05/29/12 0534 05/28/12 1423  NA 139 137 140 138 135  K 3.7 3.4* 3.7 3.7 3.8  CL 103 102 106 102 99  CO2 24 23 22 24 25   GLUCOSE 82 135* 116* 88 116*  BUN 9 5* 6 8 9   CREATININE 0.74 0.69 0.75 0.69 0.73  CALCIUM 9.1 9.2 9.2 9.4 9.7  MG -- -- -- -- --    CBG:  Lab 05/30/12 0800 05/30/12 0443 05/29/12 2330 05/29/12 1925 05/29/12 1146  GLUCAP 108* 108* 117* 153* 97  GFR Estimated Creatinine Clearance: 86.4 ml/min (by C-G formula based on Cr of 0.74).  Coagulation profile  Lab 05/30/12 0450 05/29/12 0534 05/29/12 0054  INR 1.18 1.89* 2.87*  PROTIME -- -- --    Cardiac Enzymes  Lab 05/29/12 1516 05/29/12 1012 05/29/12 0534  CKMB -- -- --  TROPONINI <0.30 <0.30 <0.30  MYOGLOBIN -- -- --    No components found with this basename: POCBNP:3 No results found for this basename: DDIMER:2 in the last 72 hours No results found for this basename: HGBA1C:2 in the last 72 hours No results found for this basename: CHOL:2,HDL:2,LDLCALC:2,TRIG:2,CHOLHDL:2,LDLDIRECT:2 in the last 72 hours No results found for this basename: TSH,T4TOTAL,FREET3,T3FREE,THYROIDAB in the last 72 hours No results  found for this basename: VITAMINB12:2,FOLATE:2,FERRITIN:2,TIBC:2,IRON:2,RETICCTPCT:2 in the last 72 hours  Basename 06/01/12 0845 05/30/12 0450  LIPASE 35 35  AMYLASE -- --    Urine Studies No results found for this basename: UACOL:2,UAPR:2,USPG:2,UPH:2,UTP:2,UGL:2,UKET:2,UBIL:2,UHGB:2,UNIT:2,UROB:2,ULEU:2,UEPI:2,UWBC:2,URBC:2,UBAC:2,CAST:2,CRYS:2,UCOM:2,BILUA:2 in the last 72 hours  MICROBIOLOGY: Recent Results (from the past 240 hour(s))  MRSA PCR SCREENING     Status: Normal   Collection Time   05/29/12  3:39 AM      Component Value Range Status Comment   MRSA by PCR NEGATIVE  NEGATIVE Final   SURGICAL PCR SCREEN     Status: Normal   Collection Time   05/31/12  3:43 AM      Component Value Range Status Comment   MRSA, PCR NEGATIVE  NEGATIVE Final    Staphylococcus aureus NEGATIVE  NEGATIVE Final     RADIOLOGY STUDIES/RESULTS: Dg Chest 2 View  05/28/2012  *RADIOLOGY REPORT*  Clinical Data: 56 year old female abdominal pain, back pain, jaundice.  CHEST - 2 VIEW  Comparison: None.  Findings: Left chest cardiac AICD.  Somewhat low lung volumes. Cardiac size and mediastinal contours are within normal limits. Visualized tracheal air column is within normal limits.  No pneumothorax, pulmonary edema or pleural effusion.  No pneumoperitoneum.  Mild scoliosis. No acute osseous abnormality identified.  IMPRESSION: Low lung volumes, otherwise no acute cardiopulmonary abnormality.   Original Report Authenticated By: Harley Hallmark, M.D.    US Abdomen Complete  05/29/2012  *RADIOLOGY REPORT*  Clinical Data:  Abdominal pain and back pain.  COMPLETE ABDOMINAL ULTRASOUND  Comparison:  CT 05/28/2012.  Findings:  Gallbladder:  Multiple gallstones are present with a large amount of biliary sludge.  Thickened wall is present although there is no sonographic Murphy's sign.  Gallbladder wall measures up to 5 mm. The largest stone measures 16 mm.  Common bile duct:  Dilated for age measuring up to 8 mm.  No  common duct stone identified.  Liver:  No focal lesion identified.  Within normal limits in parenchymal echogenicity.  IVC:  Appears normal.  Pancreas:  No focal abnormality seen.  Spleen:  6.4 cm.  Normal echotexture.  Right Kidney:  10.7 cm. Normal echotexture.  Normal central sinus echo complex.  No calculi or hydronephrosis.  Left Kidney:  9.8 cm. Normal echotexture.  Normal central sinus echo complex.  No calculi or hydronephrosis.  Abdominal aorta:  No aneurysm identified.  IMPRESSION: 1.  Cholelithiasis with gallbladder wall thickening.  The absence of the sonographic Murphy's sign is atypical and suggests chronic cholecystitis however it is difficult to completely exclude acute cholecystitis. 2.  Dilation of the common bile duct without common duct stone identified.  If cholecystectomy performed, intraoperative cholangiogram recommended.   Original Report Authenticated By: Andreas Newport, M.D.    Ct Abdomen Pelvis W Contrast  05/28/2012  *RADIOLOGY REPORT*  Clinical Data: Epigastric pain.  Pain radiating to the back.  Poor appetite.  Nausea.  No vomiting.  CT ABDOMEN AND PELVIS WITH CONTRAST  Technique:  Multidetector CT imaging of the abdomen and pelvis was performed following the standard protocol during bolus administration of intravenous contrast.  Contrast:  100 ml Omnipaque-300.  Comparison: None.  Findings: Lung Bases: Dependent atelectasis.  No airspace disease.        Liver:  Mild intrahepatic biliary ductal dilation.  Correlation with liver function studies and bilirubin recommended.  No mass lesion.  Portal vein appears normal.  Spleen:  Normal.  Gallbladder:  Hydropic with probable wall thickening and pericholecystic stranding.   Small amount of pericholecystic fluid suggest acute cholecystitis.  Common bile duct:  No common duct stone identified. Normal diameter.  Pancreas:  Normal.  Adrenal glands:  Normal.  Kidneys:  Normal enhancement and excretion.  Stomach:  Small hiatal hernia.  No  inflammatory changes.  Small bowel:  No mesenteric adenopathy.  No obstruction.  Colon:   Normal appendix.  No inflammatory changes of colon.  Pelvic Genitourinary:  9 cm x 7.5 cm intrauterine heterogeneous low density lesion most compatible with a large uterine fibroid.  No free fluid.  Left ovarian cystic lesion, likely cyst.  Bones:  No aggressive osseous lesions.  Levoconvex curvature of the lumbar spine.  L4-L5 predominant degenerative disc disease.  The pacemaker partially visualized.  Vasculature: Normal.  IMPRESSION:  1.  Constellation of findings suggesting acute cholecystitis with hydropic gallbladder, probable wall thickening and pericholecystic fluid.  Mild intrahepatic biliary ductal dilation.   No calcified cyst stones are identified in the gallbladder or common bile duct. 2.  Fibroid uterus. 3.  25 mm left ovarian cystic lesion.  Follow-up 8-week pelvic ultrasound recommended to assess for resolution.   Original Report Authenticated By: Andreas Newport, M.D.     MEDICATIONS: Scheduled Meds:    . aspirin  81 mg Oral Daily  . ciprofloxacin  400 mg Intravenous BID  . metoprolol  5 mg Intravenous QID  . metronidazole  500 mg Intravenous Q8H  . sodium chloride  3 mL Intravenous Q12H  . warfarin  5 mg Oral ONCE-1800  . Warfarin - Pharmacist Dosing Inpatient   Does not apply q1800   Continuous Infusions:    . sodium chloride 75 mL/hr at 05/31/12 1917  . DISCONTD: heparin     PRN Meds:.acetaminophen, acetaminophen, HYDROmorphone (DILAUDID) injection, ondansetron (ZOFRAN) IV, ondansetron, DISCONTD: butamben-tetracaine-benzocaine, DISCONTD: fentaNYL, DISCONTD: glucagon, DISCONTD: midazolam, DISCONTD: Omnipaque 350 mg/mL (50 mL) in 0.9% normal saline (50 mL)  Antibiotics: Anti-infectives     Start     Dose/Rate Route Frequency Ordered Stop   05/29/12 0400   metroNIDAZOLE (FLAGYL) IVPB 500 mg        500 mg 100 mL/hr over 60 Minutes Intravenous 3 times per day 05/29/12 0337      05/29/12 0400   ciprofloxacin (CIPRO) IVPB 400 mg        400 mg 200 mL/hr over 60 Minutes Intravenous 2 times daily 05/29/12 0341            Jeoffrey Massed, MD  [facility] Pager:[phone]  If 7PM-7AM, please contact night-coverage [email or web address] Password [email or web address] 06/01/2012, 11:40 AM   LOS: 4 days

## 2012-06-02 LAB — HEPATIC FUNCTION PANEL
ALT: 328 U/L — ABNORMAL HIGH (ref 0–35)
Alkaline Phosphatase: 235 U/L — ABNORMAL HIGH (ref 39–117)
Bilirubin, Direct: 2.6 mg/dL — ABNORMAL HIGH (ref 0.0–0.3)
Indirect Bilirubin: 1.4 mg/dL — ABNORMAL HIGH (ref 0.3–0.9)

## 2012-06-02 MED ORDER — WARFARIN SODIUM 5 MG PO TABS: 2.5000 mg | ORAL_TABLET | Freq: Every evening | ORAL | Status: AC

## 2012-06-02 MED ORDER — AMOXICILLIN-POT CLAVULANATE 875-125 MG PO TABS: 1.0000 | ORAL_TABLET | Freq: Two times a day (BID) | ORAL | Status: AC

## 2012-06-02 MED ORDER — ASPIRIN 81 MG PO CHEW: 81.0000 mg | CHEWABLE_TABLET | Freq: Every day | ORAL | Status: AC

## 2012-06-02 MED ORDER — CARVEDILOL 12.5 MG PO TABS
12.5000 mg | ORAL_TABLET | Freq: Two times a day (BID) | ORAL | Status: DC
  Administered 2012-06-02: 12.5 mg via ORAL
  Filled 2012-06-02 (×2): qty 1

## 2012-06-02 NOTE — Discharge Summary (Signed)
PATIENT DETAILS Name: Beth May Age: 56 y.o. Sex: female Date of Birth: 1956-06-10 MRN: [id number]. Admit Date: 05/28/2012 Admitting Physician: Eduard Clos, MD PCP:No primary provider on file.  Recommendations for Outpatient Follow-up:  1. No coumadin for 7 days from 05/31/12 2. Follow up LFT's to make sure they normalize 3. Needs General Surgery referral for cholecystectomy 4. Needs Pelvic Ultrasound to evaluate Ovarian cyst  PRIMARY DISCHARGE DIAGNOSIS:  Principal Problem:  *Acute cholecystitis with chronic cholecystitis Active Problems: Obstructive Jaundice  Hyperbilirubinemia  Atrial fibrillation maintaining V-paced rhythm  H/O cardiac pacemaker  Choledocholithiasis  Warfarin anticoagulation  Transaminitis  Dehydration  Preop cardiovascular exam  Nonspecific elevation of levels of transaminase or lactic acid dehydrogenase (LDH)  Nonspecific (abnormal) findings on radiological and other examination of biliary tract  Calculus of bile duct without mention of cholecystitis or obstruction      PAST MEDICAL HISTORY: Past Medical History  Diagnosis Date  . ICD (implantable cardiac defibrillator) in place 2009    [employer]  . Hypertension   . Atrial fibrillation 2009  . Pacemaker   . Bell's palsy 2008    DISCHARGE MEDICATIONS:   Medication List     As of 06/02/2012  9:38 AM    TAKE these medications         amoxicillin-clavulanate 875-125 MG per tablet   Commonly known as: AUGMENTIN   Take 1 tablet by mouth 2 (two) times daily.      aspirin 81 MG chewable tablet   Chew 1 tablet (81 mg total) by mouth daily.      carvedilol 12.5 MG tablet   Commonly known as: COREG   Take 12.5 mg by mouth 2 (two) times daily with a meal.      enalapril 2.5 MG tablet   Commonly known as: VASOTEC   Take 3.75 mg by mouth 2 (two) times daily.      omeprazole 20 MG capsule   Commonly known as: PRILOSEC   Take 1 capsule (20 mg total) by mouth daily.     potassium chloride 10 MEQ tablet   Commonly known as: K-DUR   Take 10 mEq by mouth daily.      warfarin 5 MG tablet   Commonly known as: COUMADIN   Take 0.5-1 tablets (2.5-5 mg total) by mouth every evening. Take 5mg  (one tablet) on all days EXCEPT on Thursday and Sunday. On Thursday and Sunday, take 2.5mg  (one-half tablet).TO RESUME 7 DAYS AFTER 05/31/12.          BRIEF HPI:  See H&P, Labs, Consult and Test reports for all details in brief, 56 year old female with history of atrial fibrillation, tachybradycardia syndrome status post pacemaker placement on Coumadin presents with complaints of right upper quadrant pain and back pain. Patient has been having these symptoms for last 3 weeks. Patient initially thought it was food poisoning. But since the symptoms has been persistent and worsening patient had come to the ER. In the ER CT abdomen and pelvis shows features of cholecystitis and also has lab showing jaundice.  CONSULTATIONS:   {cardiology, GI and general surgery PERTINENT RADIOLOGIC STUDIES: Dg Chest 2 View  05/28/2012  *RADIOLOGY REPORT*  Clinical Data: 56 year old female abdominal pain, back pain, jaundice.  CHEST - 2 VIEW  Comparison: None.  Findings: Left chest cardiac AICD.  Somewhat low lung volumes. Cardiac size and mediastinal contours are within normal limits. Visualized tracheal air column is within normal limits.  No pneumothorax, pulmonary edema or pleural effusion.  No pneumoperitoneum.  Mild scoliosis. No acute osseous abnormality identified.  IMPRESSION: Low lung volumes, otherwise no acute cardiopulmonary abnormality.   Original Report Authenticated By: Harley Hallmark, M.D.    US Abdomen Complete  05/29/2012  *RADIOLOGY REPORT*  Clinical Data:  Abdominal pain and back pain.  COMPLETE ABDOMINAL ULTRASOUND  Comparison:  CT 05/28/2012.  Findings:  Gallbladder:  Multiple gallstones are present with a large amount of biliary sludge.  Thickened wall is present although there  is no sonographic Murphy's sign.  Gallbladder wall measures up to 5 mm. The largest stone measures 16 mm.  Common bile duct:  Dilated for age measuring up to 8 mm.  No common duct stone identified.  Liver:  No focal lesion identified.  Within normal limits in parenchymal echogenicity.  IVC:  Appears normal.  Pancreas:  No focal abnormality seen.  Spleen:  6.4 cm.  Normal echotexture.  Right Kidney:  10.7 cm. Normal echotexture.  Normal central sinus echo complex.  No calculi or hydronephrosis.  Left Kidney:  9.8 cm. Normal echotexture.  Normal central sinus echo complex.  No calculi or hydronephrosis.  Abdominal aorta:  No aneurysm identified.  IMPRESSION: 1.  Cholelithiasis with gallbladder wall thickening.  The absence of the sonographic Murphy's sign is atypical and suggests chronic cholecystitis however it is difficult to completely exclude acute cholecystitis. 2.  Dilation of the common bile duct without common duct stone identified.  If cholecystectomy performed, intraoperative cholangiogram recommended.   Original Report Authenticated By: Andreas Newport, M.D.    Ct Abdomen Pelvis W Contrast  05/28/2012  *RADIOLOGY REPORT*  Clinical Data: Epigastric pain.  Pain radiating to the back.  Poor appetite.  Nausea.  No vomiting.  CT ABDOMEN AND PELVIS WITH CONTRAST  Technique:  Multidetector CT imaging of the abdomen and pelvis was performed following the standard protocol during bolus administration of intravenous contrast.  Contrast:  100 ml Omnipaque-300.  Comparison: None.  Findings: Lung Bases: Dependent atelectasis.  No airspace disease.        Liver:  Mild intrahepatic biliary ductal dilation.  Correlation with liver function studies and bilirubin recommended.  No mass lesion.  Portal vein appears normal.  Spleen:  Normal.  Gallbladder:  Hydropic with probable wall thickening and pericholecystic stranding.   Small amount of pericholecystic fluid suggest acute cholecystitis.  Common bile duct:  No common  duct stone identified. Normal diameter.  Pancreas:  Normal.  Adrenal glands:  Normal.  Kidneys:  Normal enhancement and excretion.  Stomach:  Small hiatal hernia.  No inflammatory changes.  Small bowel:  No mesenteric adenopathy.  No obstruction.  Colon:   Normal appendix.  No inflammatory changes of colon.  Pelvic Genitourinary:  9 cm x 7.5 cm intrauterine heterogeneous low density lesion most compatible with a large uterine fibroid.  No free fluid.  Left ovarian cystic lesion, likely cyst.  Bones:  No aggressive osseous lesions.  Levoconvex curvature of the lumbar spine.  L4-L5 predominant degenerative disc disease.  The pacemaker partially visualized.  Vasculature: Normal.  IMPRESSION:  1.  Constellation of findings suggesting acute cholecystitis with hydropic gallbladder, probable wall thickening and pericholecystic fluid.  Mild intrahepatic biliary ductal dilation.   No calcified cyst stones are identified in the gallbladder or common bile duct. 2.  Fibroid uterus. 3.  25 mm left ovarian cystic lesion.  Follow-up 8-week pelvic ultrasound recommended to assess for resolution.   Original Report Authenticated By: Andreas Newport, M.D.    Dg C-arm 1-60 Min-no Report  05/31/2012  CLINICAL DATA: common bile duct stones   C-ARM 1-60 MINUTES  Fluoroscopy was utilized by the requesting physician.  No radiographic  interpretation.       PERTINENT LAB RESULTS: CBC:  Basename 06/01/12 0845 05/31/12 0500  WBC 7.7 11.4*  HGB 12.6 12.3  HCT 37.5 36.5  PLT 366 348   CMET CMP     Component Value Date/Time   NA 139 06/01/2012 0845   K 3.7 06/01/2012 0845   CL 103 06/01/2012 0845   CO2 24 06/01/2012 0845   GLUCOSE 82 06/01/2012 0845   BUN 9 06/01/2012 0845   CREATININE 0.74 06/01/2012 0845   CALCIUM 9.1 06/01/2012 0845   PROT 6.7 06/02/2012 0550   ALBUMIN 3.0* 06/02/2012 0550   AST 278* 06/02/2012 0550   ALT 328* 06/02/2012 0550   ALKPHOS 235* 06/02/2012 0550   BILITOT 4.0* 06/02/2012 0550   GFRNONAA >90  06/01/2012 0845   GFRAA >90 06/01/2012 0845    GFR Estimated Creatinine Clearance: 86.4 ml/min (by C-G formula based on Cr of 0.74).  Basename 06/01/12 0845  LIPASE 35  AMYLASE --   No results found for this basename: CKTOTAL:3,CKMB:3,CKMBINDEX:3,TROPONINI:3 in the last 72 hours No components found with this basename: POCBNP:3 No results found for this basename: DDIMER:2 in the last 72 hours No results found for this basename: HGBA1C:2 in the last 72 hours No results found for this basename: CHOL:2,HDL:2,LDLCALC:2,TRIG:2,CHOLHDL:2,LDLDIRECT:2 in the last 72 hours No results found for this basename: TSH,T4TOTAL,FREET3,T3FREE,THYROIDAB in the last 72 hours No results found for this basename: VITAMINB12:2,FOLATE:2,FERRITIN:2,TIBC:2,IRON:2,RETICCTPCT:2 in the last 72 hours Coags: No results found for this basename: PT:2,INR:2 in the last 72 hours Microbiology: Recent Results (from the past 240 hour(s))  MRSA PCR SCREENING     Status: Normal   Collection Time   05/29/12  3:39 AM      Component Value Range Status Comment   MRSA by PCR NEGATIVE  NEGATIVE Final   SURGICAL PCR SCREEN     Status: Normal   Collection Time   05/31/12  3:43 AM      Component Value Range Status Comment   MRSA, PCR NEGATIVE  NEGATIVE Final    Staphylococcus aureus NEGATIVE  NEGATIVE Final      BRIEF HOSPITAL COURSE:   Principal Problem: Obstructive Jaundice  -2/2 choledocholithiasis  -s/p ERCP with stone removal  - d/w CCS-Dr Martin-he does not advise inpatient cholecystectomy-advises cholecystectomy in the next few weeks.Patient keen to do cholecytectomy in [place]. Called [facility]-patient has a follow up appointment with PCP arranged for this coming Monday. -now no abdominal pain, tolerating a diet and anxious to go home -GI has advised patient to hold off on resuming coumadin for a week from 05/31/12 as patient at risk from bleeding from her sphincterotomy site, will place on ASA till them. This has  been clearly explained to the patient.She has been clearly instructed to go to the nearest ED if melena or hematemesis. -following ERCP-her Bilirubin levels are coming down from a peak of 8.8 to 4.0 on discharge. -she will need her LFT's checked when she follows up with PCP -she will need referral to General Surgery for a cholecystectomy-hopefully this can be done while she is off the coumadin  Acute Cholangitis  -pus drained during ERCP-looks like it was not sent for c/s -was on empiric Cipro/Flagyl while inpatient-will d/c on Augmentin  Afib  -paced rhythm, per tele tech-3 pacer spikes  -EF-55-60%  -GI recommending to be off coumadin for one  week from 9/25, place on ASA in interim  -on IV lopressor while inpatient-transition to Coreg on discharge  HTN  -controlled with IV lopressor-transition back to Coreg and Enalapril on discharge  Tachy-Brady Syndrome  -PPM in place -cardiology did see this patient-while she was hospitlalized  Ovarian Cyst  -25 mm left ovarian cystic lesion seen on CT Abd/Pelvis  - Follow-up 8-week pelvic ultrasound recommended-will need to be done as outpatient   TODAY-DAY OF DISCHARGE:  Subjective:   Kristyann Braum today has no headache,no chest abdominal pain,no new weakness tingling or numbness, feels much better wants to go home today.   Objective:   Blood pressure 128/79, pulse 64, temperature 98.3 F (36.8 C), temperature source Oral, resp. rate 19, height 5\' 6"  (1.676 m), weight 85.4 kg (188 lb 4.4 oz), SpO2 96.00%.  Intake/Output Summary (Last 24 hours) at 06/02/12 0938 Last data filed at 06/02/12 0540  Gross per 24 hour  Intake   1300 ml  Output      0 ml  Net   1300 ml    Exam Awake Alert, Oriented *3, No new F.N deficits, Normal affect [place].AT,PERRAL Supple Neck,No JVD, No cervical lymphadenopathy appriciated.  Symmetrical Chest wall movement, Good air movement bilaterally, CTAB RRR,No Gallops,Rubs or new Murmurs, No Parasternal Heave +ve  B.Sounds, Abd Soft, Non tender, No organomegaly appriciated, No rebound -guarding or rigidity. No Cyanosis, Clubbing or edema, No new Rash or bruise  DISCHARGE CONDITION: Stable  DISPOSITION: HOME  DISCHARGE INSTRUCTIONS:    Activity:  As tolerated with Full fall precautions use walker/cane & assistance as needed  Diet recommendation: Heart Healthy diet  Follow-up Information    Follow up with Dr Mikey College. On 06/05/2012. (Appointment at 3:15pm)    Contact information:   [facility] Address: [address] [place], [place] [address] [phone]        Total Time spent on discharge equals 45 minutes.  SignedJeoffrey Massed 06/02/2012 9:38 AM

## 2012-06-02 NOTE — Progress Notes (Signed)
Patient discharge teaching given, including activity, diet, follow-up appoints, and medications. Patient verbalized understanding of all discharge instructions. IV access was d/c'd. Vitals are stable. Skin is intact except as charted in most recent assessments. Pt to be escorted out by NT, to be driven home by family. 

## 2012-06-02 NOTE — Progress Notes (Signed)
Patient ID: Beth May, female   DOB: 1956/03/06, 56 y.o.   MRN: [id number]  2 Days Post-Op  Subjective: No pain but had mild nausea this am, feels ok now.    Objective: Vital signs in last 24 hours: Temp:  [98.3 F (36.8 C)-98.6 F (37 C)] 98.3 F (36.8 C) (09/27 0550) Pulse Rate:  [60-67] 64  (09/27 0550) Resp:  [19-20] 19  (09/27 0550) BP: (124-159)/(68-81) 124/78 mmHg (09/27 0550) SpO2:  [96 %-98 %] 96 % (09/27 0550) Weight:  [188 lb 4.4 oz (85.4 kg)] 188 lb 4.4 oz (85.4 kg) (09/27 0550) Last BM Date: 05/31/12  Intake/Output from previous day: 09/26 0701 - 09/27 0700 In: 1300 [P.O.:700; I.V.:200; IV Piggyback:400] Out: -  Intake/Output this shift:   Physical Exam: General appearance: alert, cooperative and no distress Resp: clear to auscultation bilaterally Cardio: regular rate and rhythm GI: soft, nontender, +BS  Lab Results:   Basename 06/01/12 0845 05/31/12 0500  WBC 7.7 11.4*  HGB 12.6 12.3  HCT 37.5 36.5  PLT 366 348   BMET  Basename 06/01/12 0845 05/31/12 0500  NA 139 137  K 3.7 3.4*  CL 103 102  CO2 24 23  GLUCOSE 82 135*  BUN 9 5*  CREATININE 0.74 0.69  CALCIUM 9.1 9.2   PT/INR No results found for this basename: LABPROT:2,INR:2 in the last 72 hours ABG No results found for this basename: PHART:2,PCO2:2,PO2:2,HCO3:2 in the last 72 hours  Studies/Results: Dg C-arm 1-60 Min-no Report  05/31/2012  CLINICAL DATA: common bile duct stones   C-ARM 1-60 MINUTES  Fluoroscopy was utilized by the requesting physician.  No radiographic  interpretation.      Anti-infectives: Anti-infectives     Start     Dose/Rate Route Frequency Ordered Stop   05/29/12 0400   metroNIDAZOLE (FLAGYL) IVPB 500 mg        500 mg 100 mL/hr over 60 Minutes Intravenous 3 times per day 05/29/12 0337     05/29/12 0400   ciprofloxacin (CIPRO) IVPB 400 mg        400 mg 200 mL/hr over 60 Minutes Intravenous 2 times daily 05/29/12 0341            Assessment/Plan: s/p  Procedure(s) (LRB) with comments: ENDOSCOPIC RETROGRADE CHOLANGIOPANCREATOGRAPHY (ERCP) (N/A)  1.  Symptomatic cholelithiasis with acute on chronic cholecystitis: s/p ERCP with stone extraction and sphincterotomy, restarting heparin and need to watch for bleeding with procedure.  Will need cholecystectomy which the patient wants to have done in [place], will need to be bridge with anticoagulation once discharged from here and needs immediate follow up in [place] with a surgeon.  Will send home on 2 weeks of Augmentin. Discharge per Medical and GI service.     LOS: 5 days    Meira Wahba 06/02/2012

## 2012-06-02 NOTE — Progress Notes (Signed)
Patient ID: Beth May, female   DOB: Jun 24, 1956, 56 y.o.   MRN: [id number] Asked by Dr Jerral Ralph to review possible heart rate of 20 beats per minute yesterday. Actually, it was an artifact and that telemetry did not pick up her pacer spikes which were fully captured ventricularly. Also, her pacemaker was interrogated on admission and showed 100% V. pacing.  Her Coreg was held. I've asked him to start back at the admission dose. Her EF is 55-60% but this may be helpful in decreasing her heart rate if she goes back into A. fib.  20 minutes spent clearing up his confusion. We will sign off.

## 2012-11-23 ENCOUNTER — Encounter (HOSPITAL_COMMUNITY): Payer: Self-pay | Admitting: Emergency Medicine

## 2012-11-23 ENCOUNTER — Emergency Department (INDEPENDENT_AMBULATORY_CARE_PROVIDER_SITE_OTHER)
Admission: EM | Admit: 2012-11-23 | Discharge: 2012-11-23 | Disposition: A | Discharge status: Home / Self Care | Payer: Medicare Other | Source: Home / Self Care | Attending: Emergency Medicine | Admitting: Emergency Medicine

## 2012-11-23 DIAGNOSIS — R110 Nausea: Secondary | ICD-10-CM

## 2012-11-23 DIAGNOSIS — R51 Headache: Secondary | ICD-10-CM

## 2012-11-23 DIAGNOSIS — R42 Dizziness and giddiness: Secondary | ICD-10-CM

## 2012-11-23 DIAGNOSIS — I428 Other cardiomyopathies: Secondary | ICD-10-CM

## 2012-11-23 LAB — POCT I-STAT, CHEM 8
BUN: 17 mg/dL (ref 6–23)
Calcium, Ion: 1.21 mmol/L (ref 1.12–1.23)
Chloride: 106 mEq/L (ref 96–112)
Creatinine, Ser: 0.7 mg/dL (ref 0.50–1.10)
Glucose, Bld: 106 mg/dL — ABNORMAL HIGH (ref 70–99)
HCT: 45 % (ref 36.0–46.0)
Hemoglobin: 15.3 g/dL — ABNORMAL HIGH (ref 12.0–15.0)
Potassium: 4.4 mEq/L (ref 3.5–5.1)
Sodium: 139 mEq/L (ref 135–145)
TCO2: 26 mmol/L (ref 0–100)

## 2012-11-23 LAB — PROTIME-INR
INR: 1.96 — ABNORMAL HIGH (ref 0.00–1.49)
Prothrombin Time: 21.6 seconds — ABNORMAL HIGH (ref 11.6–15.2)

## 2012-11-23 MED ORDER — WARFARIN SODIUM 1 MG PO TABS: ORAL_TABLET | ORAL | Status: AC

## 2012-11-23 NOTE — ED Notes (Addendum)
Pt/inr draw.  Patient c/o dizziness.  C/o nausea

## 2012-11-23 NOTE — ED Provider Notes (Signed)
Chief Complaint:   Chief Complaint  Patient presents with  . Dizziness    History of Present Illness:   Beth May is a 57 year old female who is visiting here from [place] with her primary care physician being in [place]. She's here for a couple of months. She has a true fibrillation, cardiomyopathy, and implanted defibrillator/pacemaker. She is on Coumadin for her cardiac condition. She takes 5 mg on Monday through Friday and 2 a half milligrams +1 mg on Saturday and Sunday. She cannot recall what her last INR was. She hasn't had any bleeding complications, but today felt a little bit dizzy, nauseated, and had a slight headache and was thinking that her INR might be off. She denies any vertigo, or neurological symptoms, vomiting, abdominal pain, neurological symptoms, shortness of breath, or chest pain.  Review of Systems:  Other than noted above, the patient denies any of the following symptoms. Systemic:  No fever, chills, sweats, fatigue, myalgias, headache, or anorexia. Eye:  No redness, pain or drainage. ENT:  No earache, nasal congestion, rhinorrhea, sinus pressure, or sore throat. Lungs:  No cough, sputum production, wheezing, shortness of breath.  Cardiovascular:  No chest pain, palpitations, or syncope. GI:  No nausea, vomiting, abdominal pain or diarrhea. GU:  No dysuria, frequency, or hematuria. Skin:  No rash or pruritis.  PMFSH:  Past medical history, family history, social history, meds, and allergies were reviewed.  She takes carvedilol, enalapril, omeprazole, Lopressor, and furosemide. She has no known medication allergies.  Physical Exam:   Vital signs:  BP 131/91  Pulse 80  Temp(Src) 98.6 F (37 C) (Oral)  Resp 16  SpO2 100%  Filed Vitals:   11/23/12 1037 Supine  11/23/12 1125 Sitting  11/23/12 1126 Standing   BP: 128/76 142/94 131/91  Pulse: 68 80 80  Temp: 98.6 F (37 C)    TempSrc: Oral    Resp: 16    SpO2: 100%     General:  Alert, in no  distress. Eye:  PERRL, full EOMs.  Lids and conjunctivas were normal. ENT:  TMs and canals were normal, without erythema or inflammation.  Nasal mucosa was clear and uncongested, without drainage.  Mucous membranes were moist.  Pharynx was clear, without exudate or drainage.  There were no oral ulcerations or lesions. Neck:  Supple, no adenopathy, tenderness or mass. Thyroid was normal. Lungs:  No respiratory distress.  Lungs were clear to auscultation, without wheezes, rales or rhonchi.  Breath sounds were clear and equal bilaterally. Heart:  Regular rhythm, without gallops, murmers or rubs. Abdomen:  Soft, flat, and non-tender to palpation.  No hepatosplenomagaly or mass. Skin:  Clear, warm, and dry, without rash or lesions.  Labs:   Results for orders placed during the hospital encounter of 11/23/12  PROTIME-INR      Result Value Range   Prothrombin Time 21.6 (*) 11.6 - 15.2 seconds   INR 1.96 (*) 0.00 - 1.49  POCT I-STAT, CHEM 8      Result Value Range   Sodium 139  135 - 145 mEq/L   Potassium 4.4  3.5 - 5.1 mEq/L   Chloride 106  96 - 112 mEq/L   BUN 17  6 - 23 mg/dL   Creatinine, Ser 4.09  0.50 - 1.10 mg/dL   Glucose, Bld 811 (*) 70 - 99 mg/dL   Calcium, Ion 9.14  7.82 - 1.23 mmol/L   TCO2 26  0 - 100 mmol/L   Hemoglobin 15.3 (*) 12.0 -  15.0 g/dL   HCT 95.6  21.3 - 08.6 %     Date: 11/23/2012  Rate: 65  Rhythm: Paced rhythm, suspect unspecified pacemaker failure, atrial sensed ventricular paced rhythm  QRS Axis: left  Intervals: normal  ST/T Wave abnormalities: indeterminate  Conduction Disutrbances:left bundle branch block  Narrative Interpretation: Suspect unspecified pacemaker failure, atrial sensed ventricular paced rhythm,  Old EKG Reviewed: none available  Assessment:  The primary encounter diagnosis was Atrial fibrillation. Diagnoses of Cardiomyopathy, Dizziness, Nausea, and Headache were also pertinent to this visit.  Her INR is a little bit low at 1.96. I told  her to increase her Coumadin to 5 mg every day Monday through Saturday and take 3 a half milligrams on Sunday. I suggested a repeat pro time in 2 weeks, and she'll have to come back here to get that done, so she has no other physician in the area.  Plan:   1.  The following meds were prescribed:   Discharge Medication List as of 11/23/2012 11:56 AM    START taking these medications   Details  !! warfarin (COUMADIN) 1 MG tablet Take 1 daily with 2.5 mg tablet on Sunday., Normal     !! - Potential duplicate medications found. Please discuss with provider.     2.  The patient was instructed in symptomatic care and handouts were given. 3.  The patient was told to return if becoming worse in any way, if no better in 3 or 4 days, and given some red flag symptoms such as chest pain, shortness of breath, severe headache, or syncope that would indicate earlier return.    Reuben Likes, MD 11/23/12 [phone]

## 2012-12-05 ENCOUNTER — Encounter (HOSPITAL_COMMUNITY): Payer: Self-pay | Admitting: Emergency Medicine

## 2012-12-05 ENCOUNTER — Emergency Department (INDEPENDENT_AMBULATORY_CARE_PROVIDER_SITE_OTHER)
Admission: EM | Admit: 2012-12-05 | Discharge: 2012-12-05 | Disposition: A | Discharge status: Home / Self Care | Payer: Medicare Other | Source: Home / Self Care | Attending: Family Medicine | Admitting: Family Medicine

## 2012-12-05 DIAGNOSIS — Z7901 Long term (current) use of anticoagulants: Secondary | ICD-10-CM

## 2012-12-05 DIAGNOSIS — I428 Other cardiomyopathies: Secondary | ICD-10-CM

## 2012-12-05 DIAGNOSIS — S29012A Strain of muscle and tendon of back wall of thorax, initial encounter: Secondary | ICD-10-CM

## 2012-12-05 DIAGNOSIS — I4891 Unspecified atrial fibrillation: Secondary | ICD-10-CM

## 2012-12-05 DIAGNOSIS — S239XXA Sprain of unspecified parts of thorax, initial encounter: Secondary | ICD-10-CM

## 2012-12-05 DIAGNOSIS — Z5181 Encounter for therapeutic drug level monitoring: Secondary | ICD-10-CM

## 2012-12-05 MED ORDER — METAXALONE 800 MG PO TABS: 800.0000 mg | ORAL_TABLET | Freq: Three times a day (TID) | ORAL | Status: AC

## 2012-12-05 NOTE — ED Provider Notes (Signed)
History     CSN: [id number]  Arrival date & time 12/05/12  1036   First MD Initiated Contact with Patient 12/05/12 1057      Chief Complaint  Patient presents with  . Back Pain    (Consider location/radiation/quality/duration/timing/severity/associated sxs/prior treatment) Patient is a 57 y.o. female presenting with back pain. The history is provided by the patient.  Back Pain Location:  Thoracic spine Quality:  Stabbing Radiates to:  Does not radiate Pain severity:  Mild Onset quality:  Sudden (back pain this am getting oob.) Duration:  1 day Timing:  Constant Progression:  Worsening Chronicity:  New Associated symptoms: no abdominal pain, no leg pain, no numbness, no paresthesias, no pelvic pain, no tingling and no weakness     Past Medical History  Diagnosis Date  . ICD (implantable cardiac defibrillator) in place 2009    [employer]  . Hypertension   . Atrial fibrillation 2009  . Pacemaker   . Bell's palsy 2008    Past Surgical History  Procedure Laterality Date  . Pacemaker insertion  2009    combo pacer, ICD  . Tubal ligation    . Ercp  05/31/2012    Procedure: ENDOSCOPIC RETROGRADE CHOLANGIOPANCREATOGRAPHY (ERCP);  Surgeon: Meryl Dare, MD,FACG;  Location: [facility] ENDOSCOPY;  Service: Endoscopy;  Laterality: N/A;  hep needs to be off X 6hrs, should be 630a  . Cholecystectomy      Family History  Problem Relation Age of Onset  . Cancer Mother     History  Substance Use Topics  . Smoking status: Never Smoker   . Smokeless tobacco: Not on file  . Alcohol Use: No    OB History   Grav Para Term Preterm Abortions TAB SAB Ect Mult Living                  Review of Systems  Gastrointestinal: Negative.  Negative for abdominal pain.  Genitourinary: Negative for pelvic pain.  Musculoskeletal: Positive for back pain.  Neurological: Negative for tingling, weakness, numbness and paresthesias.    Allergies  Review of patient's allergies indicates  no known allergies.  Home Medications   Current Outpatient Rx  Name  Route  Sig  Dispense  Refill  . amoxicillin-clavulanate (AUGMENTIN) 875-125 MG per tablet   Oral   Take 1 tablet by mouth 2 (two) times daily.   24 tablet   0   . aspirin 81 MG chewable tablet   Oral   Chew 1 tablet (81 mg total) by mouth daily.         . carvedilol (COREG) 12.5 MG tablet   Oral   Take 12.5 mg by mouth 2 (two) times daily with a meal.         . enalapril (VASOTEC) 2.5 MG tablet   Oral   Take 3.75 mg by mouth 2 (two) times daily.          Marland Kitchen omeprazole (PRILOSEC) 20 MG capsule   Oral   Take 1 capsule (20 mg total) by mouth daily.   30 capsule   0   . potassium chloride (K-DUR) 10 MEQ tablet   Oral   Take 10 mEq by mouth daily.          Marland Kitchen warfarin (COUMADIN) 1 MG tablet      Take 1 daily with 2.5 mg tablet on Sunday.   30 tablet   0   . warfarin (COUMADIN) 5 MG tablet   Oral   Take 0.5-1  tablets (2.5-5 mg total) by mouth every evening. Take 5mg  (one tablet) on all days EXCEPT on Thursday and Sunday. On Thursday and Sunday, take 2.5mg  (one-half tablet).TO RESUME 7 DAYS AFTER 05/31/12.         Marland Kitchen metaxalone (SKELAXIN) 800 MG tablet   Oral   Take 1 tablet (800 mg total) by mouth 3 (three) times daily. For back spasms   21 tablet   0     BP 141/87  Pulse 60  Temp(Src) 97.8 F (36.6 C) (Oral)  Resp 16  SpO2 100%  Physical Exam  Nursing note and vitals reviewed. Constitutional: She is oriented to person, place, and time. She appears well-developed and well-nourished.  Neck: Normal range of motion. Neck supple.  Abdominal: Soft. Bowel sounds are normal. There is no tenderness.  Musculoskeletal: She exhibits tenderness.       Thoracic back: She exhibits decreased range of motion, tenderness, pain and spasm. She exhibits no bony tenderness, no swelling and normal pulse.  Neurological: She is alert and oriented to person, place, and time.  Skin: Skin is warm and dry.     ED Course  Procedures (including critical care time)  Labs Reviewed  PROTIME-INR - Abnormal; Notable for the following:    Prothrombin Time 18.4 (*)    INR 1.58 (*)    All other components within normal limits   No results found.   1. Upper back strain, initial encounter   2. Anticoagulation management encounter       MDM          Linna Hoff, MD 12/05/12 1354

## 2012-12-05 NOTE — ED Notes (Signed)
Pt c/o back pain since this morning. Pain is in lower back and radiates into shoulders. No urinary retention or problems. Pt also wants lab results from last visit. Took muscle relaxer with no relief. Pt is alert and oriented.

## 2013-06-20 IMAGING — CT CT ABD-PELV W/ CM
2 of 5 series · 13 of 32 positions shown, 18 images · IV contrast (water/omni  & 100ml omni 300)
Comparison: None.

CLINICAL DATA: Epigastric pain.  Pain radiating to the back.  Poor
appetite.  Nausea.  No vomiting.

CT ABDOMEN AND PELVIS WITH CONTRAST
TECHNIQUE: Multidetector CT imaging of the abdomen and pelvis was
performed following the standard protocol during bolus
administration of intravenous contrast.
Contrast:  100 ml [id number].

[Series 2: routine abdomen · axial · 0.98mm/px · z∈[-348,-68]mm · 5 of 85 slices shown, 10 images]
[im 15/85  soft-tissue]
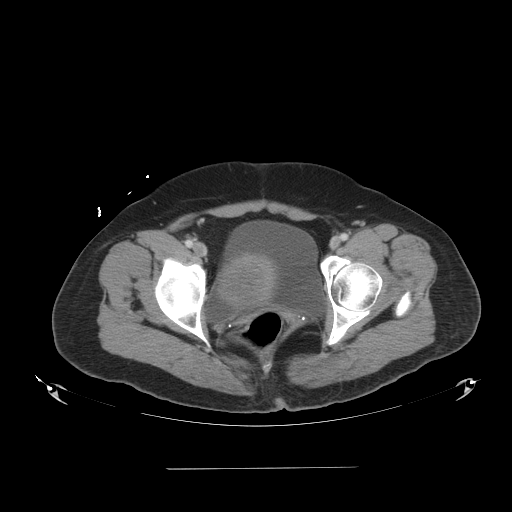
[im 15/85  bone]
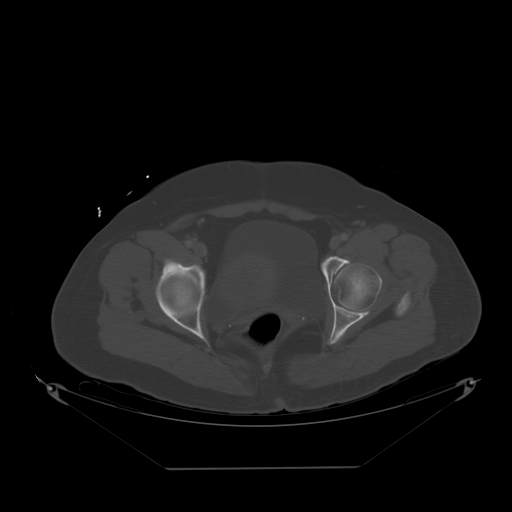
[im 29/85  soft-tissue]
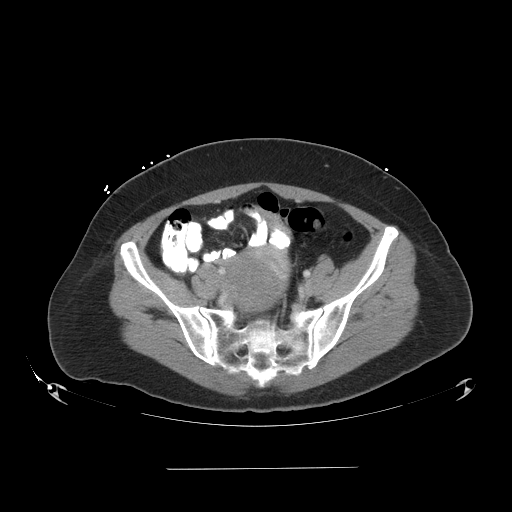
[im 29/85  lung]
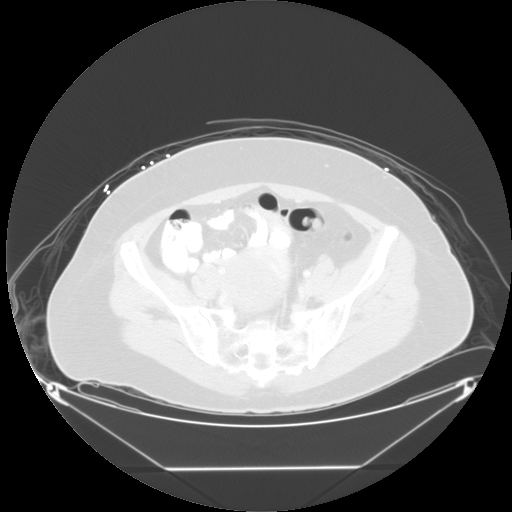
[im 43/85  soft-tissue]
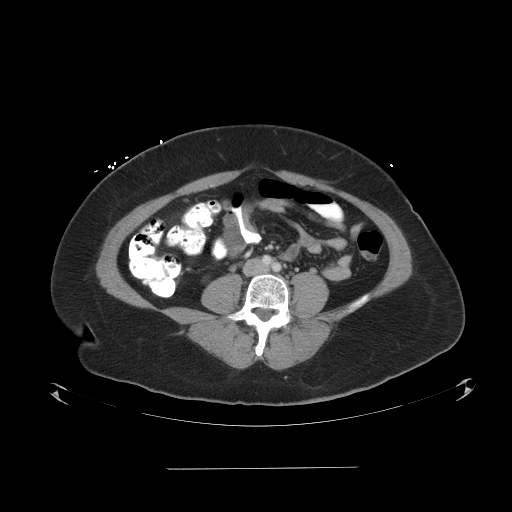
[im 43/85  lung]
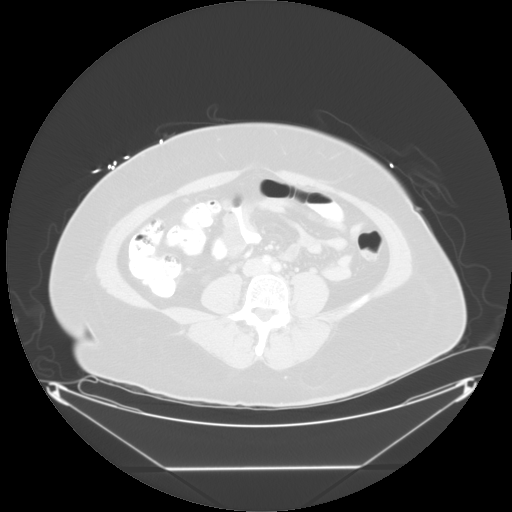
[im 57/85  soft-tissue]
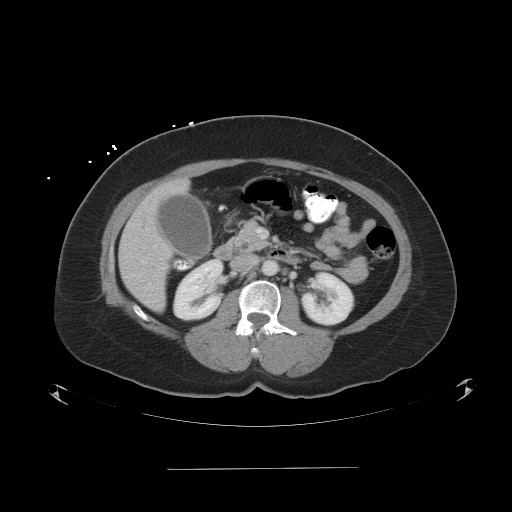
[im 57/85  lung]
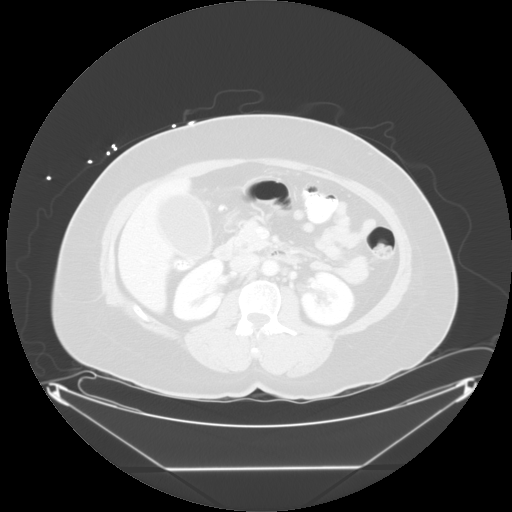
[im 71/85  soft-tissue]
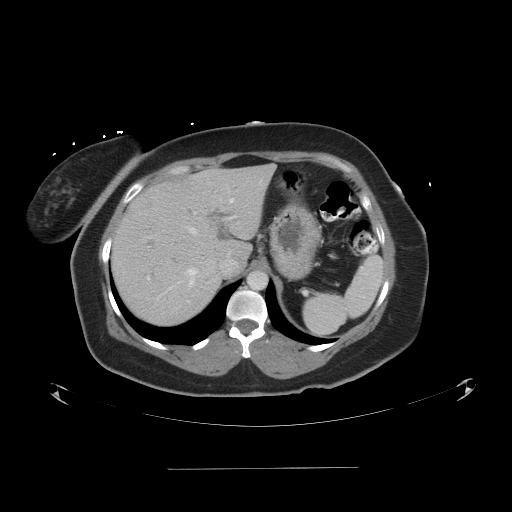
[im 71/85  lung]
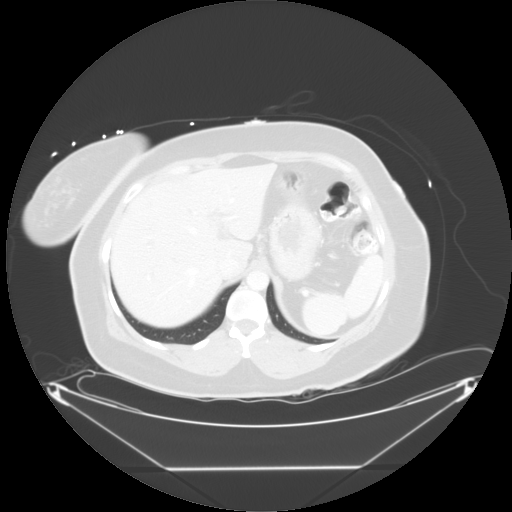

[Series 401: sagitals · sagittal · 0.98mm/px · 8 of 126 slices shown]
[im 13/126  soft-tissue]
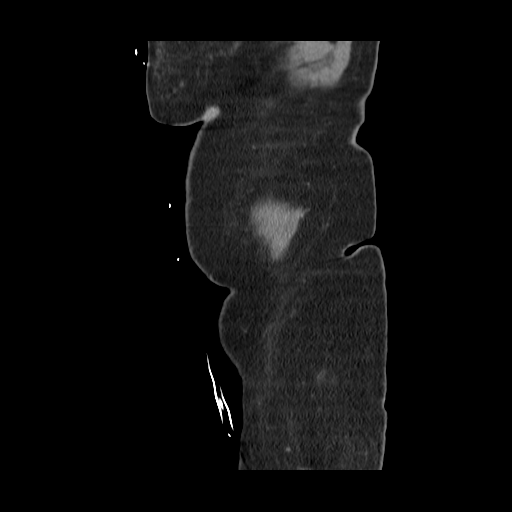
[im 26/126  soft-tissue]
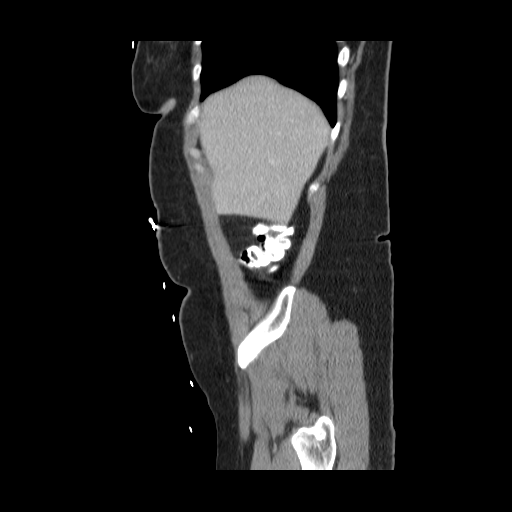
[im 38/126  soft-tissue]
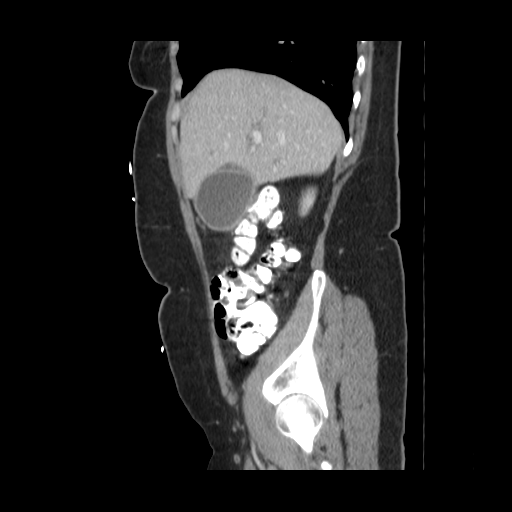
[im 51/126  soft-tissue]
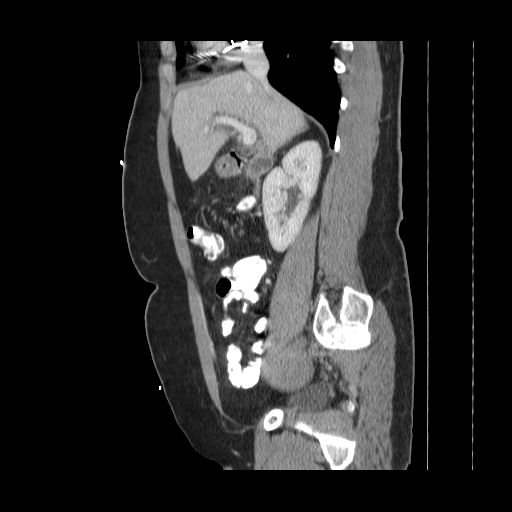
[im 76/126  soft-tissue]
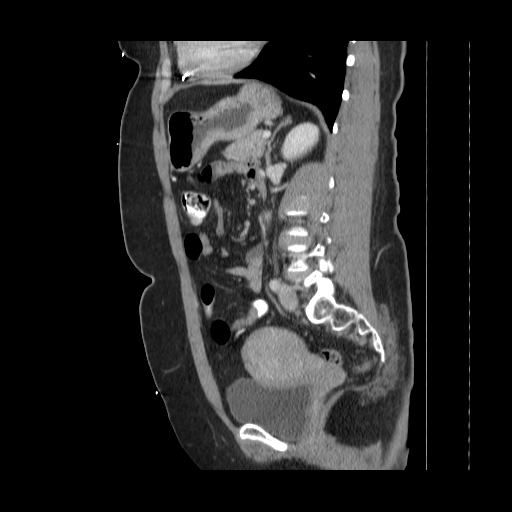
[im 88/126  soft-tissue]
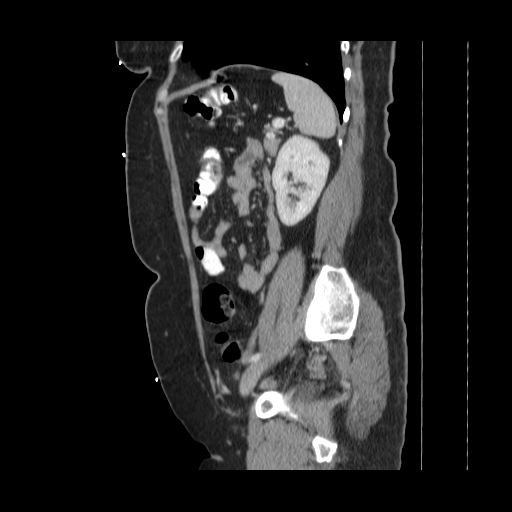
[im 101/126  soft-tissue]
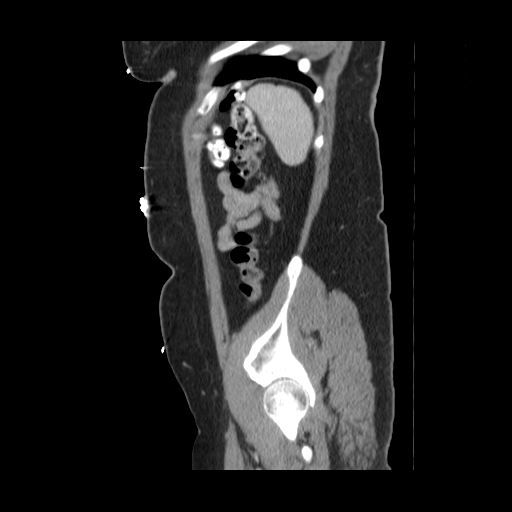
[im 113/126  soft-tissue]
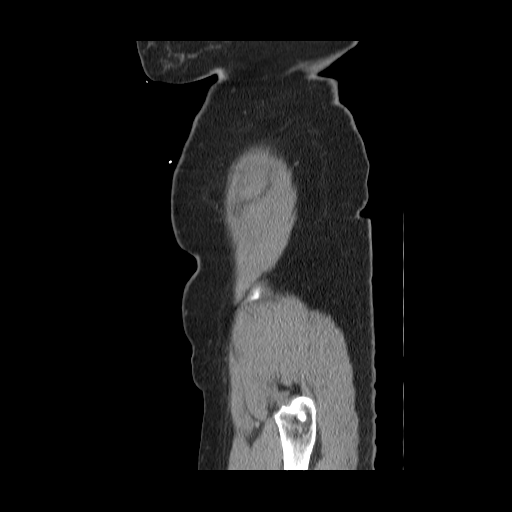

[13 of 32 positions shown; findings below may reference images not displayed]

FINDINGS: Lung Bases: Dependent atelectasis.  No airspace disease.

Liver:  Mild intrahepatic biliary ductal dilation.  Correlation
with liver function studies and bilirubin recommended.  No mass
lesion.  Portal vein appears normal.

Spleen:  Normal.

Gallbladder:  Hydropic with probable wall thickening and
pericholecystic stranding.   Small amount of pericholecystic fluid
suggest acute cholecystitis.

Common bile duct:  No common duct stone identified. Normal
diameter.

Pancreas:  Normal.

Adrenal glands:  Normal.

Kidneys:  Normal enhancement and excretion.

Stomach:  Small hiatal hernia.  No inflammatory changes.

Small bowel:  No mesenteric adenopathy.  No obstruction.

Colon:   Normal appendix.  No inflammatory changes of colon.

Pelvic Genitourinary:  9 cm x 7.5 cm intrauterine heterogeneous low
density lesion most compatible with a large uterine fibroid.  No
free fluid.  Left ovarian cystic lesion, likely cyst.

Bones:  No aggressive osseous lesions.  Levoconvex curvature of the
lumbar spine.  L4-L5 predominant degenerative disc disease.  The
pacemaker partially visualized.

Vasculature: Normal.
IMPRESSION: 1.  Constellation of findings suggesting acute cholecystitis with
hydropic gallbladder, probable wall thickening and pericholecystic
fluid.  Mild intrahepatic biliary ductal dilation.   No calcified
cyst stones are identified in the gallbladder or common bile duct.
2.  Fibroid uterus.
3.  25 mm left ovarian cystic lesion.  Follow-up 8-week pelvic
ultrasound recommended to assess for resolution.

## 2013-06-20 IMAGING — US US ABDOMEN COMPLETE
1 series · 13 of 25 positions shown · non-contrast
Comparison: CT 05/28/2012.

CLINICAL DATA: Abdominal pain and back pain.

COMPLETE ABDOMINAL ULTRASOUND

[Series 1: us abdomen complete · 0.26mm/px · 103 acquisitions, 13 frames shown]
[im 1/103]
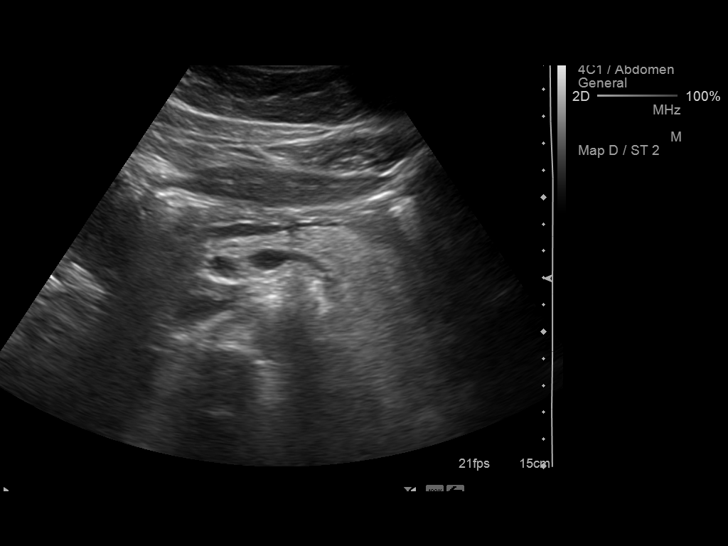
[im 9/103]
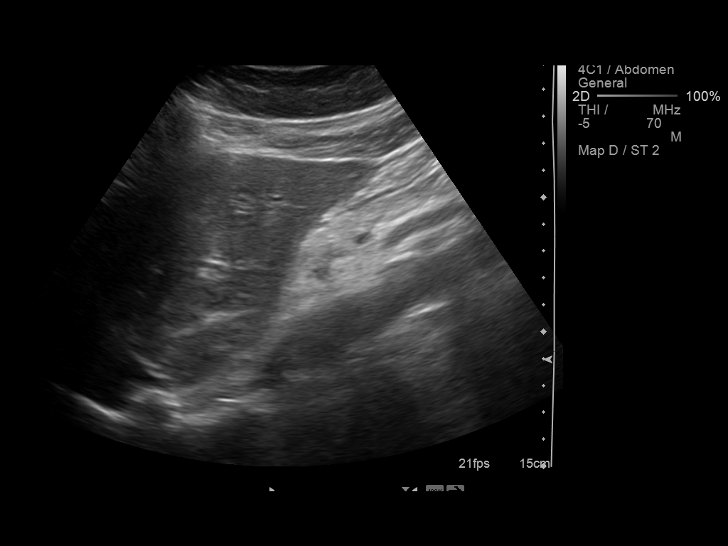
[im 18/103]
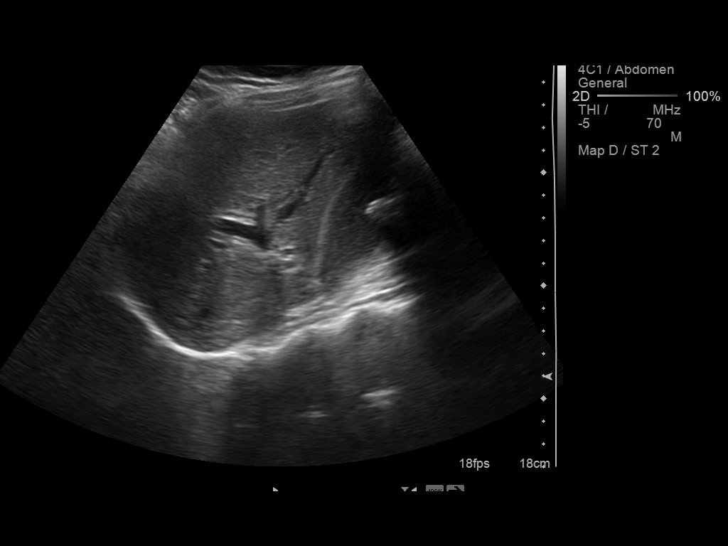
[im 26/103]
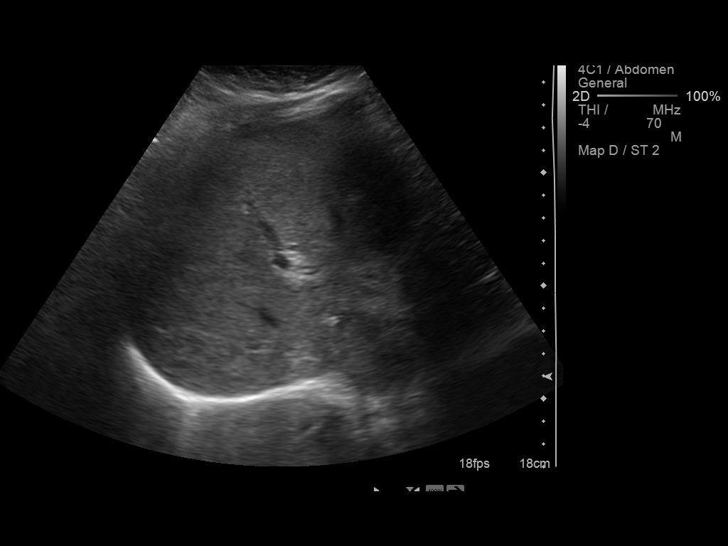
[im 35/103]
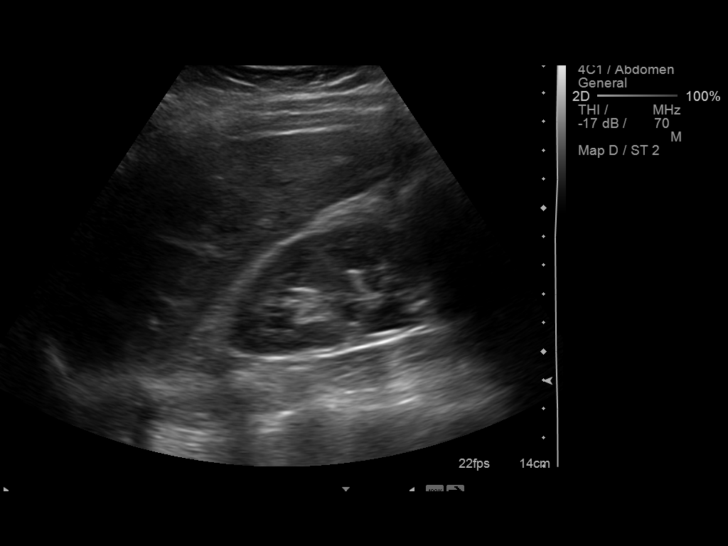
[im 43/103]
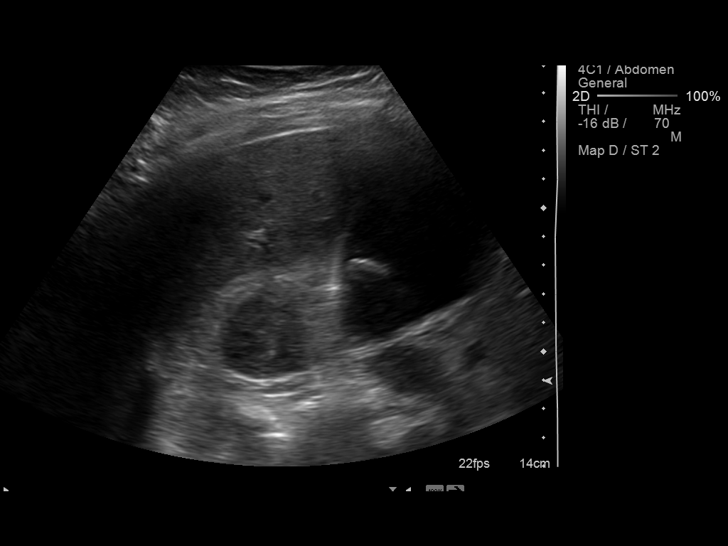
[im 52/103]
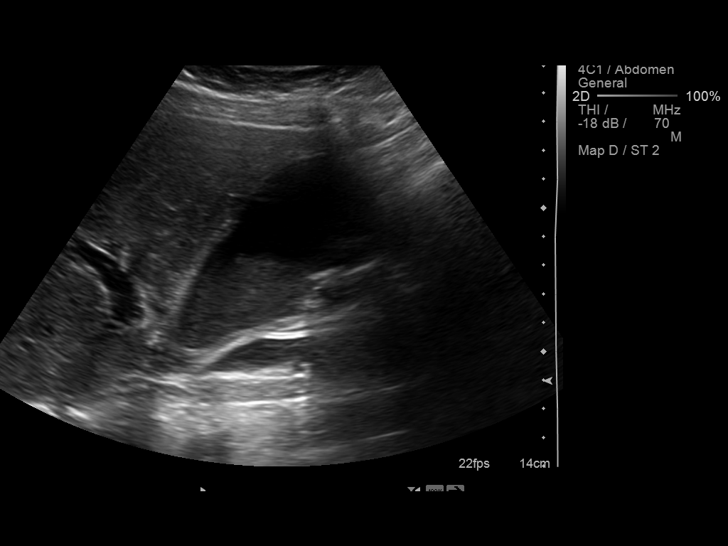
[im 60/103]
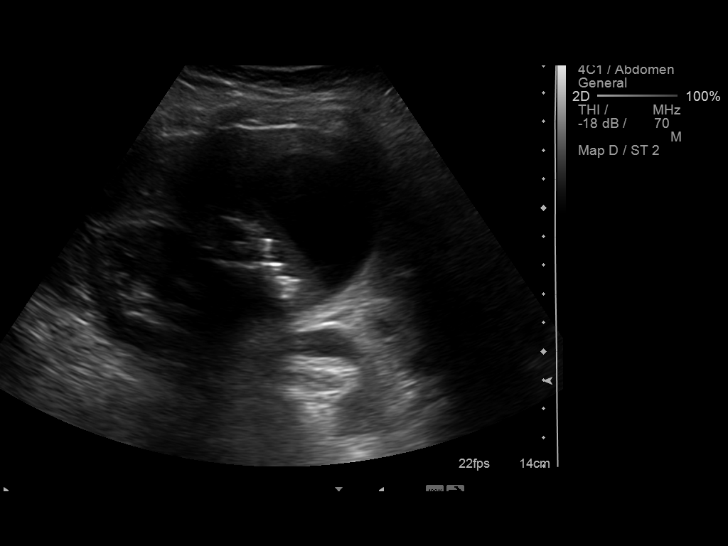
[im 69/103]
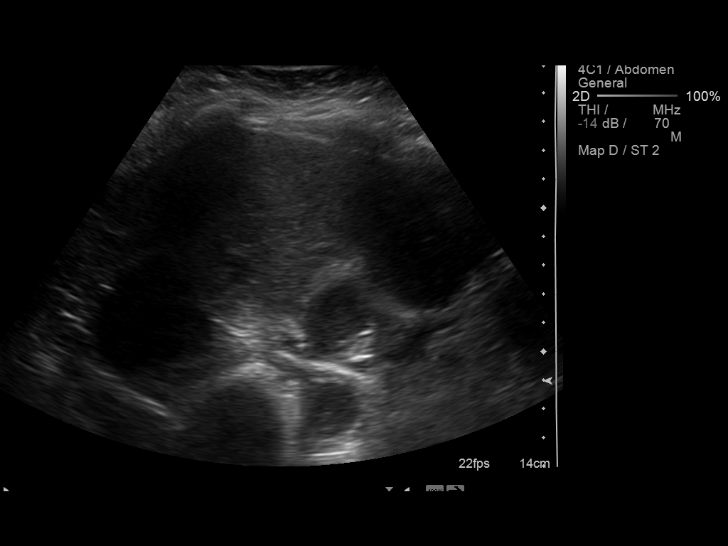
[im 77/103]
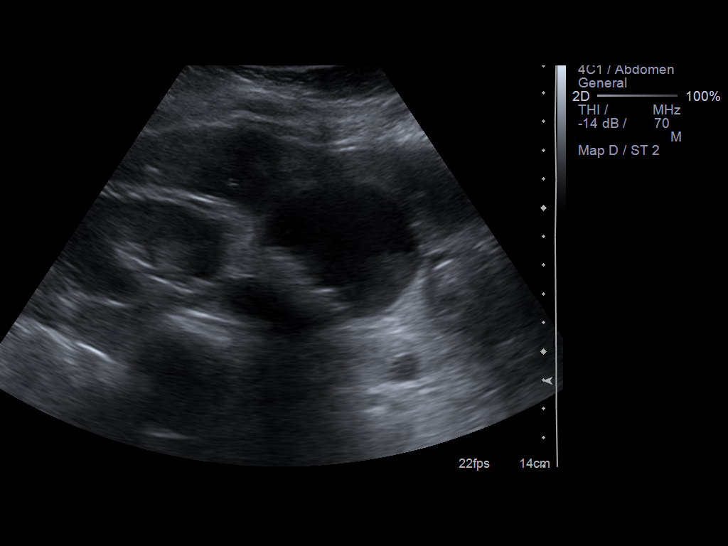
[im 86/103]
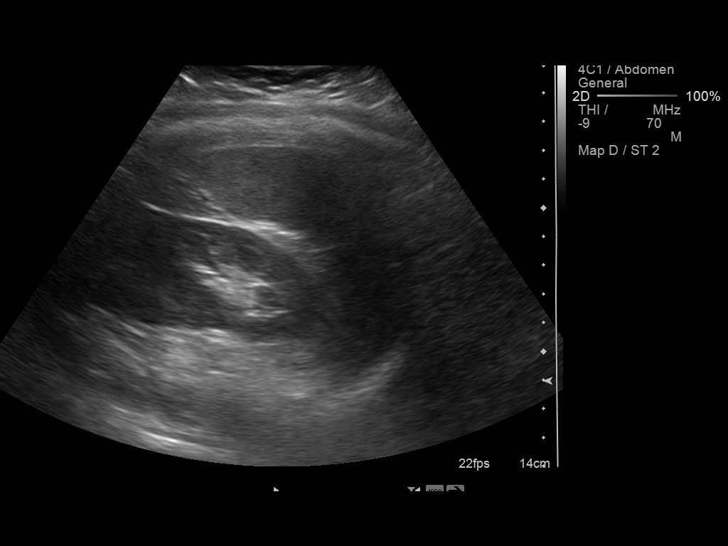
[im 94/103]
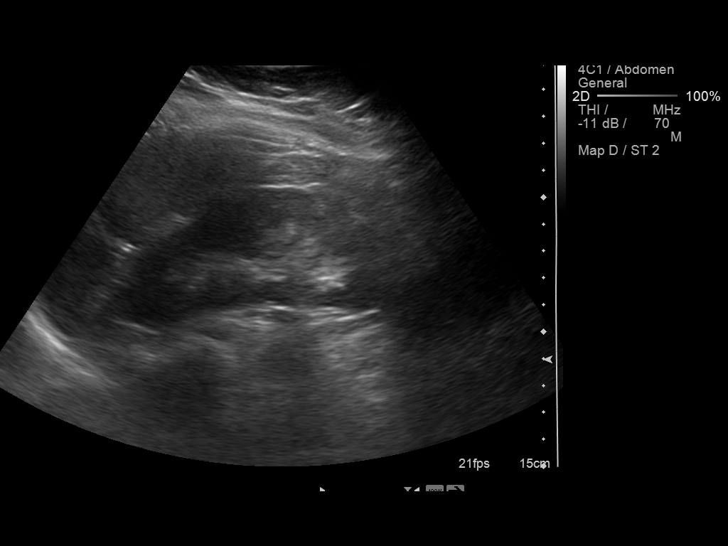
[im 103/103]
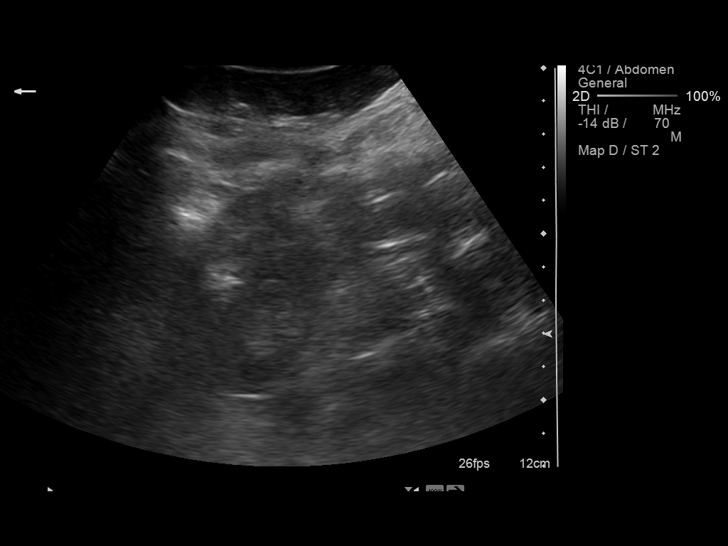

[13 of 25 positions shown; findings below may reference images not displayed]

FINDINGS: Gallbladder:  Multiple gallstones are present with a large amount
of biliary sludge.  Thickened wall is present although there is no
sonographic Murphy's sign.  Gallbladder wall measures up to 5 mm.
The largest stone measures 16 mm.

Common bile duct:  Dilated for age measuring up to 8 mm.  No common
duct stone identified.

Liver:  No focal lesion identified.  Within normal limits in
parenchymal echogenicity.

IVC:  Appears normal.

Pancreas:  No focal abnormality seen.

Spleen:  6.4 cm.  Normal echotexture.

Right Kidney:  10.7 cm. Normal echotexture.  Normal central sinus
echo complex.  No calculi or hydronephrosis.

Left Kidney:  9.8 cm. Normal echotexture.  Normal central sinus
echo complex.  No calculi or hydronephrosis.

Abdominal aorta:  No aneurysm identified.
IMPRESSION: 1.  Cholelithiasis with gallbladder wall thickening.  The absence
of the sonographic Murphy's sign is atypical and suggests chronic
cholecystitis however it is difficult to completely exclude acute
cholecystitis.
2.  Dilation of the common bile duct without common duct stone
identified.  If cholecystectomy performed, intraoperative
cholangiogram recommended.

## 2017-06-29 ENCOUNTER — Encounter (HOSPITAL_COMMUNITY): Payer: Self-pay | Admitting: Emergency Medicine

## 2017-06-29 ENCOUNTER — Ambulatory Visit (HOSPITAL_COMMUNITY)
Admission: EM | Admit: 2017-06-29 | Discharge: 2017-06-29 | Disposition: A | Discharge status: Home / Self Care | Payer: Medicare (Managed Care) | Attending: Family Medicine | Admitting: Family Medicine

## 2017-06-29 DIAGNOSIS — Z95810 Presence of automatic (implantable) cardiac defibrillator: Secondary | ICD-10-CM | POA: Insufficient documentation

## 2017-06-29 DIAGNOSIS — N390 Urinary tract infection, site not specified: Secondary | ICD-10-CM | POA: Insufficient documentation

## 2017-06-29 DIAGNOSIS — R109 Unspecified abdominal pain: Secondary | ICD-10-CM

## 2017-06-29 DIAGNOSIS — R791 Abnormal coagulation profile: Secondary | ICD-10-CM | POA: Diagnosis not present

## 2017-06-29 DIAGNOSIS — Z7901 Long term (current) use of anticoagulants: Secondary | ICD-10-CM | POA: Diagnosis not present

## 2017-06-29 DIAGNOSIS — I10 Essential (primary) hypertension: Secondary | ICD-10-CM | POA: Insufficient documentation

## 2017-06-29 DIAGNOSIS — Z7982 Long term (current) use of aspirin: Secondary | ICD-10-CM | POA: Diagnosis not present

## 2017-06-29 DIAGNOSIS — I4891 Unspecified atrial fibrillation: Secondary | ICD-10-CM | POA: Diagnosis not present

## 2017-06-29 DIAGNOSIS — Z9049 Acquired absence of other specified parts of digestive tract: Secondary | ICD-10-CM | POA: Diagnosis not present

## 2017-06-29 DIAGNOSIS — R7889 Finding of other specified substances, not normally found in blood: Secondary | ICD-10-CM

## 2017-06-29 DIAGNOSIS — R319 Hematuria, unspecified: Secondary | ICD-10-CM | POA: Diagnosis present

## 2017-06-29 DIAGNOSIS — Z79899 Other long term (current) drug therapy: Secondary | ICD-10-CM | POA: Diagnosis not present

## 2017-06-29 LAB — POCT URINALYSIS DIP (DEVICE)
Bilirubin Urine: NEGATIVE
GLUCOSE, UA: NEGATIVE mg/dL
Ketones, ur: NEGATIVE mg/dL
Nitrite: NEGATIVE
PROTEIN: 30 mg/dL — AB
UROBILINOGEN UA: 0.2 mg/dL (ref 0.0–1.0)
pH: 7 (ref 5.0–8.0)

## 2017-06-29 LAB — PROTIME-INR
INR: 5.06
Prothrombin Time: 45.1 seconds — ABNORMAL HIGH (ref 11.4–15.2)

## 2017-06-29 MED ORDER — CEPHALEXIN 500 MG PO CAPS: 500.0000 mg | ORAL_CAPSULE | Freq: Four times a day (QID) | ORAL | 0 refills | Status: AC

## 2017-06-29 NOTE — Discharge Instructions (Signed)
Take the antibiotic as directed. Drink plenty of fluids. The INR is elevated meaning your blood is to them. Stop taking the Coumadin for 2 days then restart her usual dose on Saturday. Follow-up with the Coumadin clinic on Monday. He will likely need to follow-up with your primary care doctor as well.

## 2017-06-29 NOTE — ED Provider Notes (Signed)
[facility]    CSN: [id number] Arrival date & time: 06/29/17  1403     History   Chief Complaint Chief Complaint  Patient presents with  . Abdominal Pain  . Hematuria    HPI Beth May is a 61 y.o. female.   61 year old female presents today because of the discoloration of her urine for the past 2-3 days. She is also had some discomfort with urination although vaguely described. She states that there has been some discomfort to the right inguinal/ pelvis and across the low back. She is currently taking Coumadin for atrial fibrillation. Denies chest pain, shortness of breath or diaphoresis. Occasionally has nausea but no vomiting.  History of cholecystectomy approximately 3 years ago in [place]. No history of hysterectomy. The patient believes that sometimes she may have uterine bleeding but not recently. She does not appointment with a gynecologist and almost 3 weeks. Appt Coumadin clinic Monday, 5 d.      Past Medical History:  Diagnosis Date  . Atrial fibrillation 2009  . Bell's palsy 2008  . Hypertension   . ICD (implantable cardiac defibrillator) in place 2009   [employer]  . Pacemaker     Patient Active Problem List   Diagnosis Date Noted  . Calculus of bile duct without mention of cholecystitis or obstruction 05/31/2012  . Acute cholecystitis with chronic cholecystitis 05/29/2012  . Hyperbilirubinemia 05/29/2012  . Atrial fibrillation maintaining V-paced rhythm 05/29/2012  . H/O cardiac pacemaker 05/29/2012  . Choledocholithiasis 05/29/2012  . Warfarin anticoagulation 05/29/2012  . Transaminitis 05/29/2012  . Dehydration 05/29/2012  . Preop cardiovascular exam 05/29/2012  . Nonspecific elevation of levels of transaminase or lactic acid dehydrogenase (LDH) 05/29/2012  . Nonspecific (abnormal) findings on radiological and other examination of biliary tract 05/29/2012    Past Surgical History:  Procedure Laterality Date  .  CHOLECYSTECTOMY    . ERCP  05/31/2012   Procedure: ENDOSCOPIC RETROGRADE CHOLANGIOPANCREATOGRAPHY (ERCP);  Surgeon: Meryl Dare, MD,FACG;  Location: [facility];  Service: Endoscopy;  Laterality: N/A;  hep needs to be off X 6hrs, should be 630a  . PACEMAKER INSERTION  2009   combo pacer, ICD  . TUBAL LIGATION      OB History    No data available       Home Medications    Prior to Admission medications   Medication Sig Start Date End Date Taking? Authorizing Provider  carvedilol (COREG) 12.5 MG tablet Take 12.5 mg by mouth 2 (two) times daily with a meal.   Yes [provider]  potassium chloride (K-DUR) 10 MEQ tablet Take 10 mEq by mouth daily.    Yes [provider]  sacubitril-valsartan (ENTRESTO) 49-51 MG Take 1 tablet by mouth 2 (two) times daily.   Yes [provider]  warfarin (COUMADIN) 1 MG tablet Take 1 daily with 2.5 mg tablet on Sunday. Patient taking differently: 2 mg. Take 1 daily with 2.5 mg tablet on Sunday. 11/23/12  Yes Reuben Likes, MD  warfarin (COUMADIN) 5 MG tablet Take 0.5-1 tablets (2.5-5 mg total) by mouth every evening. Take 5mg  (one tablet) on all days EXCEPT on Thursday and Sunday. On Thursday and Sunday, take 2.5mg  (one-half tablet).TO RESUME 7 DAYS AFTER 05/31/12. Patient taking differently: Take 5 mg by mouth every evening. Take 5mg  (one tablet) on all days EXCEPT on Thursday and Sunday. On Thursday and Sunday, take 2.5mg  (one-half tablet).TO RESUME 7 DAYS AFTER 05/31/12. 06/02/12  Yes Ghimire, Werner Lean, MD  amoxicillin-clavulanate (  AUGMENTIN) 875-125 MG per tablet Take 1 tablet by mouth 2 (two) times daily. 06/02/12   Ghimire, Werner LeanShanker M, MD  aspirin 81 MG chewable tablet Chew 1 tablet (81 mg total) by mouth daily. 06/02/12   Ghimire, Werner LeanShanker M, MD  cephALEXin (KEFLEX) 500 MG capsule Take 1 capsule (500 mg total) by mouth 4 (four) times daily. 06/29/17   Hayden RasmussenMabe, Tarrie Mcmichen, NP  enalapril (VASOTEC) 2.5 MG tablet Take 3.75 mg by mouth 2  (two) times daily.     [provider]  metaxalone (SKELAXIN) 800 MG tablet Take 1 tablet (800 mg total) by mouth 3 (three) times daily. For back spasms 12/05/12   Linna HoffKindl, James D, MD  omeprazole (PRILOSEC) 20 MG capsule Take 1 capsule (20 mg total) by mouth daily. 05/10/12 05/10/13  Moreno-Coll, Adlih, MD    Family History Family History  Problem Relation Age of Onset  . Cancer Mother     Social History Social History  Substance Use Topics  . Smoking status: Never Smoker  . Smokeless tobacco: Not on file  . Alcohol use No     Allergies   Patient has no known allergies.   Review of Systems Review of Systems  Constitutional: Negative.  Negative for fever.  HENT: Negative.   Respiratory: Negative.   Gastrointestinal: Positive for nausea. Negative for vomiting.       As per history of present illness  Genitourinary: Positive for dysuria and hematuria.       Suprapubic discomfort  Skin: Negative.   Neurological: Negative.   All other systems reviewed and are negative.    Physical Exam Triage Vital Signs ED Triage Vitals  Enc Vitals Group     BP      Pulse      Resp      Temp      Temp src      SpO2      Weight      Height      Head Circumference      Peak Flow      Pain Score      Pain Loc      Pain Edu?      Excl. in GC?    No data found.   Updated Vital Signs BP (!) 162/94   Pulse 71   Temp 97.9 F (36.6 C) (Oral)   Resp 16   Wt 192 lb (87.1 kg)   SpO2 100%   BMI 30.99 kg/m   Visual Acuity Right Eye Distance:   Left Eye Distance:   Bilateral Distance:    Right Eye Near:   Left Eye Near:    Bilateral Near:     Physical Exam  Constitutional: She is oriented to person, place, and time. She appears well-developed and well-nourished. No distress.  HENT:  No evidence of ENT bleeding  Eyes: EOM are normal.  Neck: Neck supple.  Cardiovascular: Normal rate.   Pulmonary/Chest: Effort normal and breath sounds normal.  Abdominal: Bowel  sounds are normal. She exhibits no distension. There is no tenderness. There is no rebound and no guarding.  No tenderness to the anterior pelvis or across the suprapubic area. No abdominal tenderness.  Musculoskeletal: Normal range of motion. She exhibits no edema or deformity.  No tenderness across the back.  Neurological: She is alert and oriented to person, place, and time.  Skin: Skin is warm and dry.  Psychiatric: She has a normal mood and affect.  Nursing note and vitals reviewed.  UC Treatments / Results  Labs (all labs ordered are listed, but only abnormal results are displayed) Labs Reviewed  PROTIME-INR - Abnormal; Notable for the following:       Result Value   Prothrombin Time 45.1 (*)    INR 5.06 (*)    All other components within normal limits  POCT URINALYSIS DIP (DEVICE) - Abnormal; Notable for the following:    Hgb urine dipstick LARGE (*)    Protein, ur 30 (*)    Leukocytes, UA LARGE (*)    All other components within normal limits    EKG  EKG Interpretation None       Radiology No results found.  Procedures Procedures (including critical care time)  Medications Ordered in UC Medications - No data to display   Initial Impression / Assessment and Plan / UC Course  I have reviewed the triage vital signs and the nursing notes.  Pertinent labs & imaging results that were available during my care of the patient were reviewed by me and considered in my medical decision making (see chart for details).    Take the antibiotic as directed. Drink plenty of fluids. The INR is elevated meaning your blood is to thin. Stop taking the Coumadin for 2 days then restart her usual dose on Saturday. Follow-up with the Coumadin clinic on Monday. , will likely need to follow-up with your primary care doctor as well.    Final Clinical Impressions(s) / UC Diagnoses   Final diagnoses:  Lower urinary tract infectious disease  Elevated INR (international normalized  ratio) due to prior anticoagulant medication ingestion    New Prescriptions New Prescriptions   CEPHALEXIN (KEFLEX) 500 MG CAPSULE    Take 1 capsule (500 mg total) by mouth 4 (four) times daily.     Controlled Substance Prescriptions [place] Controlled Substance Registry consulted? Not Applicable   Hayden Rasmussen, NP 06/29/17 1606

## 2017-06-29 NOTE — ED Triage Notes (Signed)
PT reports she has taken coumadin since about 2011. PT reports bloody urine for 2 days. PT also reports menstrual- like cramping in lower abdomen and back since yesterday.   PT last had coumadin levels checked last Monday.

## 2017-06-29 NOTE — ED Notes (Signed)
INR 5.06 per laboratory. Hayden Rasmussenavid Mabe NP made aware.

## 2018-04-04 ENCOUNTER — Encounter (HOSPITAL_COMMUNITY): Payer: Self-pay

## 2018-04-04 ENCOUNTER — Ambulatory Visit (HOSPITAL_COMMUNITY)
Admission: EM | Admit: 2018-04-04 | Discharge: 2018-04-04 | Disposition: A | Discharge status: Home / Self Care | Payer: Medicare (Managed Care) | Attending: Internal Medicine | Admitting: Internal Medicine

## 2018-04-04 DIAGNOSIS — Z7901 Long term (current) use of anticoagulants: Secondary | ICD-10-CM | POA: Insufficient documentation

## 2018-04-04 DIAGNOSIS — I4891 Unspecified atrial fibrillation: Secondary | ICD-10-CM | POA: Insufficient documentation

## 2018-04-04 DIAGNOSIS — T148XXA Other injury of unspecified body region, initial encounter: Secondary | ICD-10-CM

## 2018-04-04 DIAGNOSIS — S40011A Contusion of right shoulder, initial encounter: Secondary | ICD-10-CM

## 2018-04-04 DIAGNOSIS — Z950 Presence of cardiac pacemaker: Secondary | ICD-10-CM | POA: Diagnosis not present

## 2018-04-04 DIAGNOSIS — R791 Abnormal coagulation profile: Secondary | ICD-10-CM | POA: Insufficient documentation

## 2018-04-04 DIAGNOSIS — Z7982 Long term (current) use of aspirin: Secondary | ICD-10-CM | POA: Insufficient documentation

## 2018-04-04 DIAGNOSIS — R58 Hemorrhage, not elsewhere classified: Secondary | ICD-10-CM | POA: Insufficient documentation

## 2018-04-04 DIAGNOSIS — Z79899 Other long term (current) drug therapy: Secondary | ICD-10-CM | POA: Diagnosis not present

## 2018-04-04 DIAGNOSIS — I10 Essential (primary) hypertension: Secondary | ICD-10-CM | POA: Diagnosis not present

## 2018-04-04 DIAGNOSIS — Z9049 Acquired absence of other specified parts of digestive tract: Secondary | ICD-10-CM | POA: Insufficient documentation

## 2018-04-04 LAB — CBC
HEMATOCRIT: 41 % (ref 36.0–46.0)
Hemoglobin: 13.4 g/dL (ref 12.0–15.0)
MCH: 28.9 pg (ref 26.0–34.0)
MCHC: 32.7 g/dL (ref 30.0–36.0)
MCV: 88.6 fL (ref 78.0–100.0)
PLATELETS: 400 10*3/uL (ref 150–400)
RBC: 4.63 MIL/uL (ref 3.87–5.11)
RDW: 12.5 % (ref 11.5–15.5)
WBC: 7.3 10*3/uL (ref 4.0–10.5)

## 2018-04-04 LAB — PROTIME-INR
INR: 4.78 — AB
Prothrombin Time: 44.5 seconds — ABNORMAL HIGH (ref 11.4–15.2)

## 2018-04-04 MED ORDER — TRAMADOL HCL 50 MG PO TABS: 50.0000 mg | ORAL_TABLET | Freq: Every evening | ORAL | 0 refills | Status: AC | PRN

## 2018-04-04 NOTE — ED Notes (Signed)
Critical lab given to Beth May

## 2018-04-04 NOTE — ED Provider Notes (Signed)
[facility]    CSN: [id number] Arrival date & time: 04/04/18  1601     History   Chief Complaint Chief Complaint  Patient presents with  . INR levels critically high, bruising and body pain    HPI Beth May is a 62 y.o. female.   She is visiting the area.  She had an INR drawn yesterday, and was contacted today with an elevated level, 5.2.  She has some peripheral bruising, and her provider recommended that she come in for evaluation.  Bruise over right shoulder blade is quite sore. She is not lightheaded.  She is not vomiting, has no diarrhea.  She did have some dark stools last week.  No bleeding gums or nose bleed.  She says that her Coumadin therapy is because of her pacemaker; also has a fib.    HPI  Past Medical History:  Diagnosis Date  . Atrial fibrillation 2009  . Bell's palsy 2008  . Hypertension   . ICD (implantable cardiac defibrillator) in place 2009   [employer]  . Pacemaker     Patient Active Problem List   Diagnosis Date Noted  . Calculus of bile duct without mention of cholecystitis or obstruction 05/31/2012  . Acute cholecystitis with chronic cholecystitis 05/29/2012  . Hyperbilirubinemia 05/29/2012  . Atrial fibrillation maintaining V-paced rhythm 05/29/2012  . H/O cardiac pacemaker 05/29/2012  . Choledocholithiasis 05/29/2012  . Warfarin anticoagulation 05/29/2012  . Transaminitis 05/29/2012  . Dehydration 05/29/2012  . Preop cardiovascular exam 05/29/2012  . Nonspecific elevation of levels of transaminase or lactic acid dehydrogenase (LDH) 05/29/2012  . Nonspecific (abnormal) findings on radiological and other examination of biliary tract 05/29/2012    Past Surgical History:  Procedure Laterality Date  . CHOLECYSTECTOMY    . ERCP  05/31/2012   Procedure: ENDOSCOPIC RETROGRADE CHOLANGIOPANCREATOGRAPHY (ERCP);  Surgeon: Meryl DareMalcolm T Stark, MD,FACG;  [facility];  Service: Endoscopy;  Laterality: N/A;  hep needs to be off  X 6hrs, should be 630a  . PACEMAKER INSERTION  2009   combo pacer, ICD  . TUBAL LIGATION       Home Medications    Prior to Admission medications   Medication Sig Start Date End Date Taking? Authorizing Provider  amoxicillin-clavulanate (AUGMENTIN) 875-125 MG per tablet Take 1 tablet by mouth 2 (two) times daily. 06/02/12   Ghimire, Werner LeanShanker M, MD  aspirin 81 MG chewable tablet Chew 1 tablet (81 mg total) by mouth daily. 06/02/12   Ghimire, Werner LeanShanker M, MD  carvedilol (COREG) 12.5 MG tablet Take 12.5 mg by mouth 2 (two) times daily with a meal.    [provider]  cephALEXin (KEFLEX) 500 MG capsule Take 1 capsule (500 mg total) by mouth 4 (four) times daily. 06/29/17   Hayden RasmussenMabe, David, NP  enalapril (VASOTEC) 2.5 MG tablet Take 3.75 mg by mouth 2 (two) times daily.     [provider]  metaxalone (SKELAXIN) 800 MG tablet Take 1 tablet (800 mg total) by mouth 3 (three) times daily. For back spasms 12/05/12   Linna HoffKindl, James D, MD  omeprazole (PRILOSEC) 20 MG capsule Take 1 capsule (20 mg total) by mouth daily. 05/10/12 05/10/13  Moreno-Coll, Adlih, MD  potassium chloride (K-DUR) 10 MEQ tablet Take 10 mEq by mouth daily.     [provider]  sacubitril-valsartan (ENTRESTO) 49-51 MG Take 1 tablet by mouth 2 (two) times daily.    [provider]  traMADol (ULTRAM) 50 MG tablet Take 1 tablet (50 mg total) by  mouth at bedtime as needed. 04/04/18   Isa Rankin, MD  warfarin (COUMADIN) 1 MG tablet Take 1 daily with 2.5 mg tablet on Sunday. Patient taking differently: 2 mg. Take 1 daily with 2.5 mg tablet on Sunday. 11/23/12   Reuben Likes, MD  warfarin (COUMADIN) 5 MG tablet Take 0.5-1 tablets (2.5-5 mg total) by mouth every evening. Take 5mg  (one tablet) on all days EXCEPT on Thursday and Sunday. On Thursday and Sunday, take 2.5mg  (one-half tablet).TO RESUME 7 DAYS AFTER 05/31/12. Patient taking differently: Take 5 mg by mouth every evening. Take 5mg  (one tablet) on all  days EXCEPT on Thursday and Sunday. On Thursday and Sunday, take 2.5mg  (one-half tablet).TO RESUME 7 DAYS AFTER 05/31/12. 06/02/12   Maretta Bees, MD    Family History Family History  Problem Relation Age of Onset  . Cancer Mother     Social History Social History   Tobacco Use  . Smoking status: Never Smoker  Substance Use Topics  . Alcohol use: No  . Drug use: No     Allergies   Patient has no known allergies.   Review of Systems Review of Systems  All other systems reviewed and are negative.    Physical Exam Triage Vital Signs ED Triage Vitals [04/04/18 1626]  Enc Vitals Group     BP (!) 146/80     Pulse Rate 75     Resp 20     Temp 98.7 F (37.1 C)     Temp Source Temporal     SpO2 98 %     Weight      Height      Pain Score      Pain Loc    Updated Vital Signs BP (!) 146/80 (BP Location: Left Arm)   Pulse 75   Temp 98.7 F (37.1 C) (Temporal)   Resp 20   SpO2 98%  Physical Exam  Constitutional: She is oriented to person, place, and time. No distress.  HENT:  Head: Atraumatic.  No gum bleeding  Eyes:  Conjugate gaze observed, no eye redness/discharge  Neck: Neck supple.  Cardiovascular: Normal rate.  Pulmonary/Chest: No respiratory distress.  Abdominal: She exhibits no distension.  Musculoskeletal: Normal range of motion.  Neurological: She is alert and oriented to person, place, and time.  Skin: Skin is warm and dry.  Couple bruises on the left foot, one on the left lower abdomen.  Larger bruise over right shoulder blade, 3.5 ".  Nursing note and vitals reviewed.    UC Treatments / Results  Labs Results for orders placed or performed during the hospital encounter of 04/04/18  CBC  Result Value Ref Range   WBC 7.3 4.0 - 10.5 K/uL   RBC 4.63 3.87 - 5.11 MIL/uL   Hemoglobin 13.4 12.0 - 15.0 g/dL   HCT 46.9 62.9 - 52.8 %   MCV 88.6 78.0 - 100.0 fL   MCH 28.9 26.0 - 34.0 pg   MCHC 32.7 30.0 - 36.0 g/dL   RDW 41.3 24.4 - 01.0 %    Platelets 400 150 - 400 K/uL  Protime-INR  Result Value Ref Range   Prothrombin Time 44.5 (H) 11.4 - 15.2 seconds   INR 4.78 (HH)      Final Clinical Impressions(s) / UC Diagnoses   Final diagnoses:  Bruising  Elevated INR     Discharge Instructions     INR was 4.78 today, decreased from 5.2 yesterday.  Hemoglobin (red blood count) was 13.4 (  normal).  Your bruises are sore and there are several but there does not seem to be immediate danger.  Do not take warfarin again until your doctor tells you to.  Would recheck INR on Thursday 8/1 or Friday 8/2 this week.  Please go to the ED if you become suddenly very dizzy.     ED Prescriptions    Medication Sig Dispense Auth. Provider   traMADol (ULTRAM) 50 MG tablet Take 1 tablet (50 mg total) by mouth at bedtime as needed. 6 tablet Isa Rankin, MD        Isa Rankin, MD 04/06/18 2206

## 2018-04-04 NOTE — ED Triage Notes (Signed)
Pt presents with INR levels critically high, bruising and body pain

## 2018-04-04 NOTE — Discharge Instructions (Addendum)
INR was 4.78 today, decreased from 5.2 yesterday.  Hemoglobin (red blood count) was 13.4 (normal).  Your bruises are sore and there are several but there does not seem to be immediate danger.  Do not take warfarin again until your doctor tells you to.  Would recheck INR on Thursday 8/1 or Friday 8/2 this week.  Please go to the ED if you become suddenly very dizzy.

## 2018-07-23 ENCOUNTER — Encounter (HOSPITAL_COMMUNITY): Payer: Self-pay | Admitting: Emergency Medicine

## 2018-07-23 ENCOUNTER — Emergency Department (HOSPITAL_COMMUNITY)
Admission: EM | Admit: 2018-07-23 | Discharge: 2018-07-23 | Disposition: A | Discharge status: Home / Self Care | Payer: Medicare (Managed Care) | Attending: Emergency Medicine | Admitting: Emergency Medicine

## 2018-07-23 ENCOUNTER — Emergency Department (HOSPITAL_COMMUNITY): Payer: Medicare (Managed Care)

## 2018-07-23 DIAGNOSIS — R059 Cough, unspecified: Secondary | ICD-10-CM

## 2018-07-23 DIAGNOSIS — R05 Cough: Secondary | ICD-10-CM | POA: Insufficient documentation

## 2018-07-23 DIAGNOSIS — Z79899 Other long term (current) drug therapy: Secondary | ICD-10-CM | POA: Diagnosis not present

## 2018-07-23 DIAGNOSIS — Z95810 Presence of automatic (implantable) cardiac defibrillator: Secondary | ICD-10-CM | POA: Diagnosis not present

## 2018-07-23 DIAGNOSIS — Z7901 Long term (current) use of anticoagulants: Secondary | ICD-10-CM | POA: Diagnosis not present

## 2018-07-23 DIAGNOSIS — Z7982 Long term (current) use of aspirin: Secondary | ICD-10-CM | POA: Diagnosis not present

## 2018-07-23 MED ORDER — PREDNISONE 50 MG PO TABS: 50.0000 mg | ORAL_TABLET | Freq: Every day | ORAL | 0 refills | Status: DC

## 2018-07-23 MED ORDER — PREDNISONE 50 MG PO TABS: 50.0000 mg | ORAL_TABLET | Freq: Every day | ORAL | 0 refills | Status: AC

## 2018-07-23 MED ORDER — ALBUTEROL SULFATE HFA 108 (90 BASE) MCG/ACT IN AERS: 1.0000 | INHALATION_SPRAY | Freq: Four times a day (QID) | RESPIRATORY_TRACT | 0 refills | Status: DC | PRN

## 2018-07-23 MED ORDER — ALBUTEROL SULFATE HFA 108 (90 BASE) MCG/ACT IN AERS: 1.0000 | INHALATION_SPRAY | Freq: Four times a day (QID) | RESPIRATORY_TRACT | 0 refills | Status: AC | PRN

## 2018-07-23 NOTE — ED Notes (Signed)
Patient transported to X-ray 

## 2018-07-23 NOTE — ED Notes (Signed)
Discharge instructions given to patient.  Pt reports she has completed her course of antibiotics (Augmentin).  PA notified and states that is fine and no need for further antibiotics at this time.  Instructions given regarding Albuterol and Prednisone sent to pharmacy that pt no longer uses.  This nurse updated med rec and updated pharmacy, notified PA, PA sent prescriptions to correct pharmacy and reprinted AVS.

## 2018-07-23 NOTE — ED Triage Notes (Signed)
Pt here visiting from  [place] with c/o cough and congestion times 3 weeks , no fevers , pt has been on amoxicillin

## 2018-07-23 NOTE — Discharge Instructions (Addendum)
Take prednisone until completed.  Use albuterol inhaler every 6 hours as needed for cough, wheezing, shortness of breath.  Please call your cardiologist tomorrow to let them know that a lead has changed in place since 2013.  They may be aware of this and they may have had a procedure since then.  There may be nothing to do.  Please return the emergency department if you develop any new or worsening symptoms.

## 2018-07-23 NOTE — ED Provider Notes (Signed)
[facility] Provider Note   CSN: [id number] Arrival date & time: 07/23/18  [id number]     History   Chief Complaint Chief Complaint  Patient presents with  . Cough  . Nasal Congestion    HPI Beth May is a 62 y.o. female with history of atrial fibrillation, ICD pacemaker in place who presents with a 3-week history of cough and nasal congestion.  She has had some intermittent right ear pain.  Patient has been taking Augmentin which was prescribed by her doctor in [place].  She also has been taking some type of cough medication with codeine.  She denies any chest pain, shortness of breath, fevers, abdominal pain, nausea, vomiting, sore throat.  HPI  Past Medical History:  Diagnosis Date  . Atrial fibrillation 2009  . Bell's palsy 2008  . Hypertension   . ICD (implantable cardiac defibrillator) in place 2009   [employer]  . Pacemaker     Patient Active Problem List   Diagnosis Date Noted  . Calculus of bile duct without mention of cholecystitis or obstruction 05/31/2012  . Acute cholecystitis with chronic cholecystitis 05/29/2012  . Hyperbilirubinemia 05/29/2012  . Atrial fibrillation maintaining V-paced rhythm 05/29/2012  . H/O cardiac pacemaker 05/29/2012  . Choledocholithiasis 05/29/2012  . Warfarin anticoagulation 05/29/2012  . Transaminitis 05/29/2012  . Dehydration 05/29/2012  . Preop cardiovascular exam 05/29/2012  . Nonspecific elevation of levels of transaminase or lactic acid dehydrogenase (LDH) 05/29/2012  . Nonspecific (abnormal) findings on radiological and other examination of biliary tract 05/29/2012    Past Surgical History:  Procedure Laterality Date  . CHOLECYSTECTOMY    . ERCP  05/31/2012   Procedure: ENDOSCOPIC RETROGRADE CHOLANGIOPANCREATOGRAPHY (ERCP);  Surgeon: Meryl DareMalcolm T Stark, MD,FACG;  [facility];  Service: Endoscopy;  Laterality: N/A;  hep needs to be off X 6hrs, should be 630a  .  PACEMAKER INSERTION  2009   combo pacer, ICD  . TUBAL LIGATION       OB History   None      Home Medications    Prior to Admission medications   Medication Sig Start Date End Date Taking? Authorizing Provider  albuterol (PROVENTIL HFA;VENTOLIN HFA) 108 (90 Base) MCG/ACT inhaler Inhale 1-2 puffs into the lungs every 6 (six) hours as needed for wheezing or shortness of breath. 07/23/18   Killian Schwer, Waylan BogaAlexandra M, PA-C  amoxicillin-clavulanate (AUGMENTIN) 875-125 MG per tablet Take 1 tablet by mouth 2 (two) times daily. 06/02/12   Ghimire, Werner LeanShanker M, MD  aspirin 81 MG chewable tablet Chew 1 tablet (81 mg total) by mouth daily. 06/02/12   Ghimire, Werner LeanShanker M, MD  carvedilol (COREG) 12.5 MG tablet Take 12.5 mg by mouth 2 (two) times daily with a meal.    [provider]  cephALEXin (KEFLEX) 500 MG capsule Take 1 capsule (500 mg total) by mouth 4 (four) times daily. 06/29/17   Hayden RasmussenMabe, David, NP  enalapril (VASOTEC) 2.5 MG tablet Take 3.75 mg by mouth 2 (two) times daily.     [provider]  metaxalone (SKELAXIN) 800 MG tablet Take 1 tablet (800 mg total) by mouth 3 (three) times daily. For back spasms 12/05/12   Linna HoffKindl, James D, MD  omeprazole (PRILOSEC) 20 MG capsule Take 1 capsule (20 mg total) by mouth daily. 05/10/12 05/10/13  Moreno-Coll, Adlih, MD  potassium chloride (K-DUR) 10 MEQ tablet Take 10 mEq by mouth daily.     [provider]  predniSONE (DELTASONE) 50 MG tablet Take 1  tablet (50 mg total) by mouth daily. 07/23/18   Shevonne Wolf, Waylan Boga, PA-C  sacubitril-valsartan (ENTRESTO) 49-51 MG Take 1 tablet by mouth 2 (two) times daily.    [provider]  traMADol (ULTRAM) 50 MG tablet Take 1 tablet (50 mg total) by mouth at bedtime as needed. 04/04/18   Isa Rankin, MD  warfarin (COUMADIN) 1 MG tablet Take 1 daily with 2.5 mg tablet on Sunday. Patient taking differently: 2 mg. Take 1 daily with 2.5 mg tablet on Sunday. 11/23/12   Reuben Likes, MD  warfarin  (COUMADIN) 5 MG tablet Take 0.5-1 tablets (2.5-5 mg total) by mouth every evening. Take 5mg  (one tablet) on all days EXCEPT on Thursday and Sunday. On Thursday and Sunday, take 2.5mg  (one-half tablet).TO RESUME 7 DAYS AFTER 05/31/12. Patient taking differently: Take 5 mg by mouth every evening. Take 5mg  (one tablet) on all days EXCEPT on Thursday and Sunday. On Thursday and Sunday, take 2.5mg  (one-half tablet).TO RESUME 7 DAYS AFTER 05/31/12. 06/02/12   Maretta Bees, MD    Family History Family History  Problem Relation Age of Onset  . Cancer Mother     Social History Social History   Tobacco Use  . Smoking status: Never Smoker  Substance Use Topics  . Alcohol use: No  . Drug use: No     Allergies   Patient has no known allergies.   Review of Systems Review of Systems  Constitutional: Negative for chills and fever.  HENT: Positive for congestion and ear pain. Negative for facial swelling and sore throat.   Respiratory: Positive for cough. Negative for shortness of breath.   Cardiovascular: Negative for chest pain.  Gastrointestinal: Negative for abdominal pain, nausea and vomiting.  Genitourinary: Negative for dysuria.  Musculoskeletal: Negative for back pain.  Skin: Negative for rash and wound.  Neurological: Negative for headaches.  Psychiatric/Behavioral: The patient is not nervous/anxious.      Physical Exam Updated Vital Signs BP (!) 146/86 (BP Location: Right Arm)   Pulse 65   Temp 97.7 F (36.5 C) (Oral)   Resp 20   SpO2 96%   Physical Exam  Constitutional: She appears well-developed and well-nourished. No distress.  HENT:  Head: Normocephalic and atraumatic.  Right Ear: Tympanic membrane normal.  Left Ear: Tympanic membrane normal.  Mouth/Throat: Oropharynx is clear and moist. No oropharyngeal exudate, posterior oropharyngeal edema, posterior oropharyngeal erythema or tonsillar abscesses.  Eyes: Pupils are equal, round, and reactive to light.  Conjunctivae are normal. Right eye exhibits no discharge. Left eye exhibits no discharge. No scleral icterus.  Neck: Normal range of motion. Neck supple. No thyromegaly present.  Cardiovascular: Normal rate, regular rhythm, normal heart sounds and intact distal pulses. Exam reveals no gallop and no friction rub.  No murmur heard. Pulmonary/Chest: Effort normal and breath sounds normal. No stridor. No respiratory distress. She has no wheezes. She has no rales.  Abdominal: Soft. Bowel sounds are normal. She exhibits no distension. There is no tenderness. There is no rebound and no guarding.  Musculoskeletal: She exhibits no edema.  Lymphadenopathy:    She has no cervical adenopathy.  Neurological: She is alert. Coordination normal.  Skin: Skin is warm and dry. No rash noted. She is not diaphoretic. No pallor.  Psychiatric: She has a normal mood and affect.  Nursing note and vitals reviewed.    ED Treatments / Results  Labs (all labs ordered are listed, but only abnormal results are displayed) Labs Reviewed - No data to  display  EKG None  Radiology Dg Chest 2 View  Result Date: 07/23/2018 CLINICAL DATA:  Cough and congestion for several weeks EXAM: CHEST - 2 VIEW COMPARISON:  05/28/2012 FINDINGS: Cardiac shadows within normal limits. Defibrillator is again noted. Some variation in the lead placement is noted of uncertain significance. Clinical correlation is recommended. Lungs are well aerated bilaterally. No acute bony abnormality is seen. IMPRESSION: Variation in the defibrillator lead placement is noted. This is of uncertain significance. Clinical correlation is recommended. No other focal abnormality is noted. Electronically Signed   By: Alcide Clever M.D.   On: 07/23/2018 11:06    Procedures Procedures (including critical care time)  Medications Ordered in ED Medications - No data to display   Initial Impression / Assessment and Plan / ED Course  I have reviewed the triage  vital signs and the nursing notes.  Pertinent labs & imaging results that were available during my care of the patient were reviewed by me and considered in my medical decision making (see chart for details).     Patient presenting with 3 weeks of cough.  Chest x-ray shows no consolidation.  There is a variation in the defibrillator lead placement.  This is in comparison to 2013.  Patient is unsure of any recent procedure which would have changed placement.  She has no chest pain or shortness of breath.  Patient's cardiologist is in [place].  Patient advised to call tomorrow to make them aware of this.  Patient will be started on burst of prednisone and albuterol.  Continue Augmentin until completion.  Return precautions discussed.  Patient understands and agrees with plan.  Patient vitals stable throughout ED course and discharged in satisfactory condition.  Patient also evaluated by my attending, Dr. Jeraldine Loots, who guided the patient's management and agrees with plan.  Final Clinical Impressions(s) / ED Diagnoses   Final diagnoses:  Cough    ED Discharge Orders         Ordered    predniSONE (DELTASONE) 50 MG tablet  Daily     07/23/18 1121    albuterol (PROVENTIL HFA;VENTOLIN HFA) 108 (90 Base) MCG/ACT inhaler  Every 6 hours PRN     07/23/18 [address] [place], PA-C 07/23/18 1128    Gerhard Munch, MD 07/23/18 1240

## 2019-08-15 IMAGING — CR DG CHEST 2V
2 series · 2 of 2 positions shown · non-contrast
Comparison: 05/28/2012

CLINICAL DATA: Cough and congestion for several weeks

EXAM:
CHEST - 2 VIEW

[chest pa]
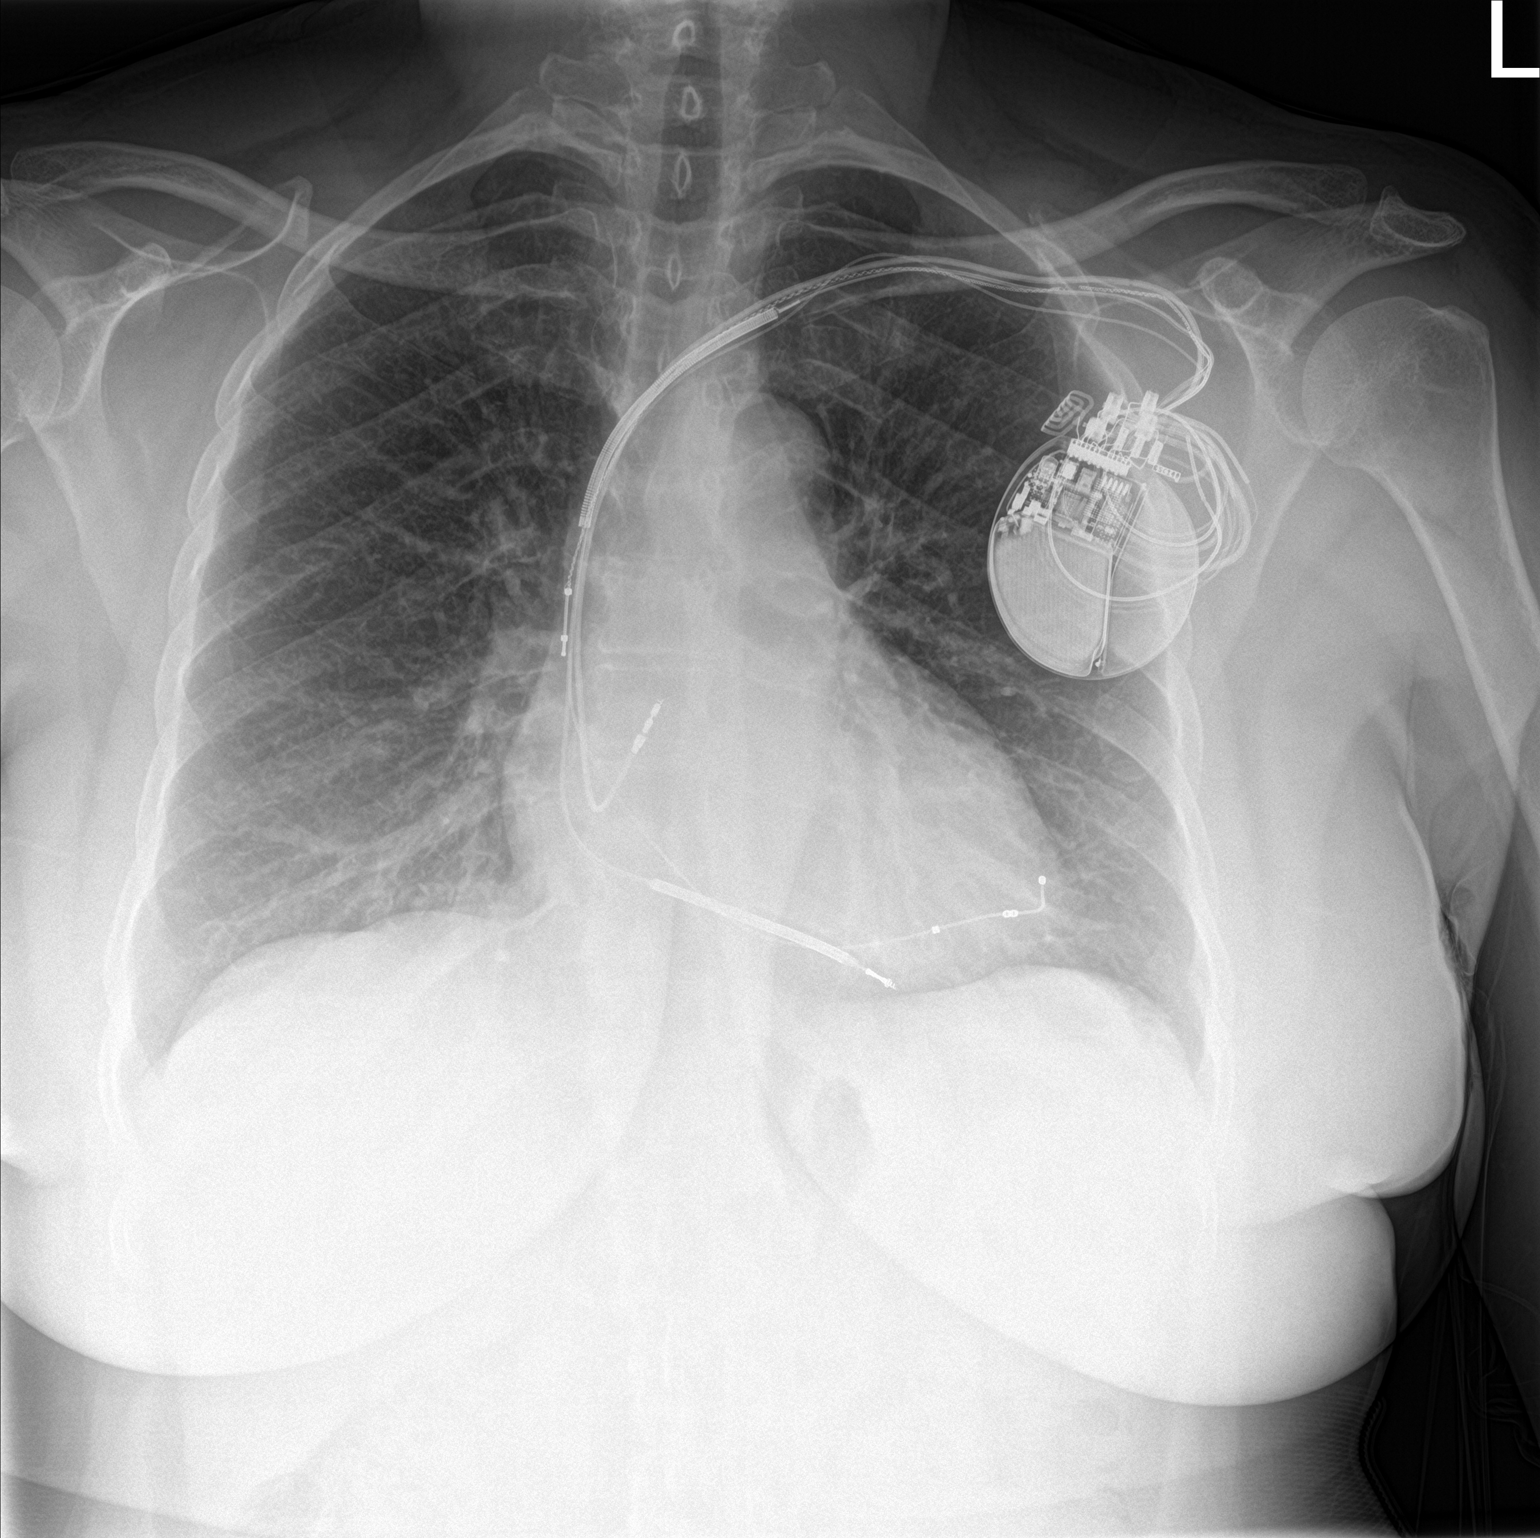

[chest lat]
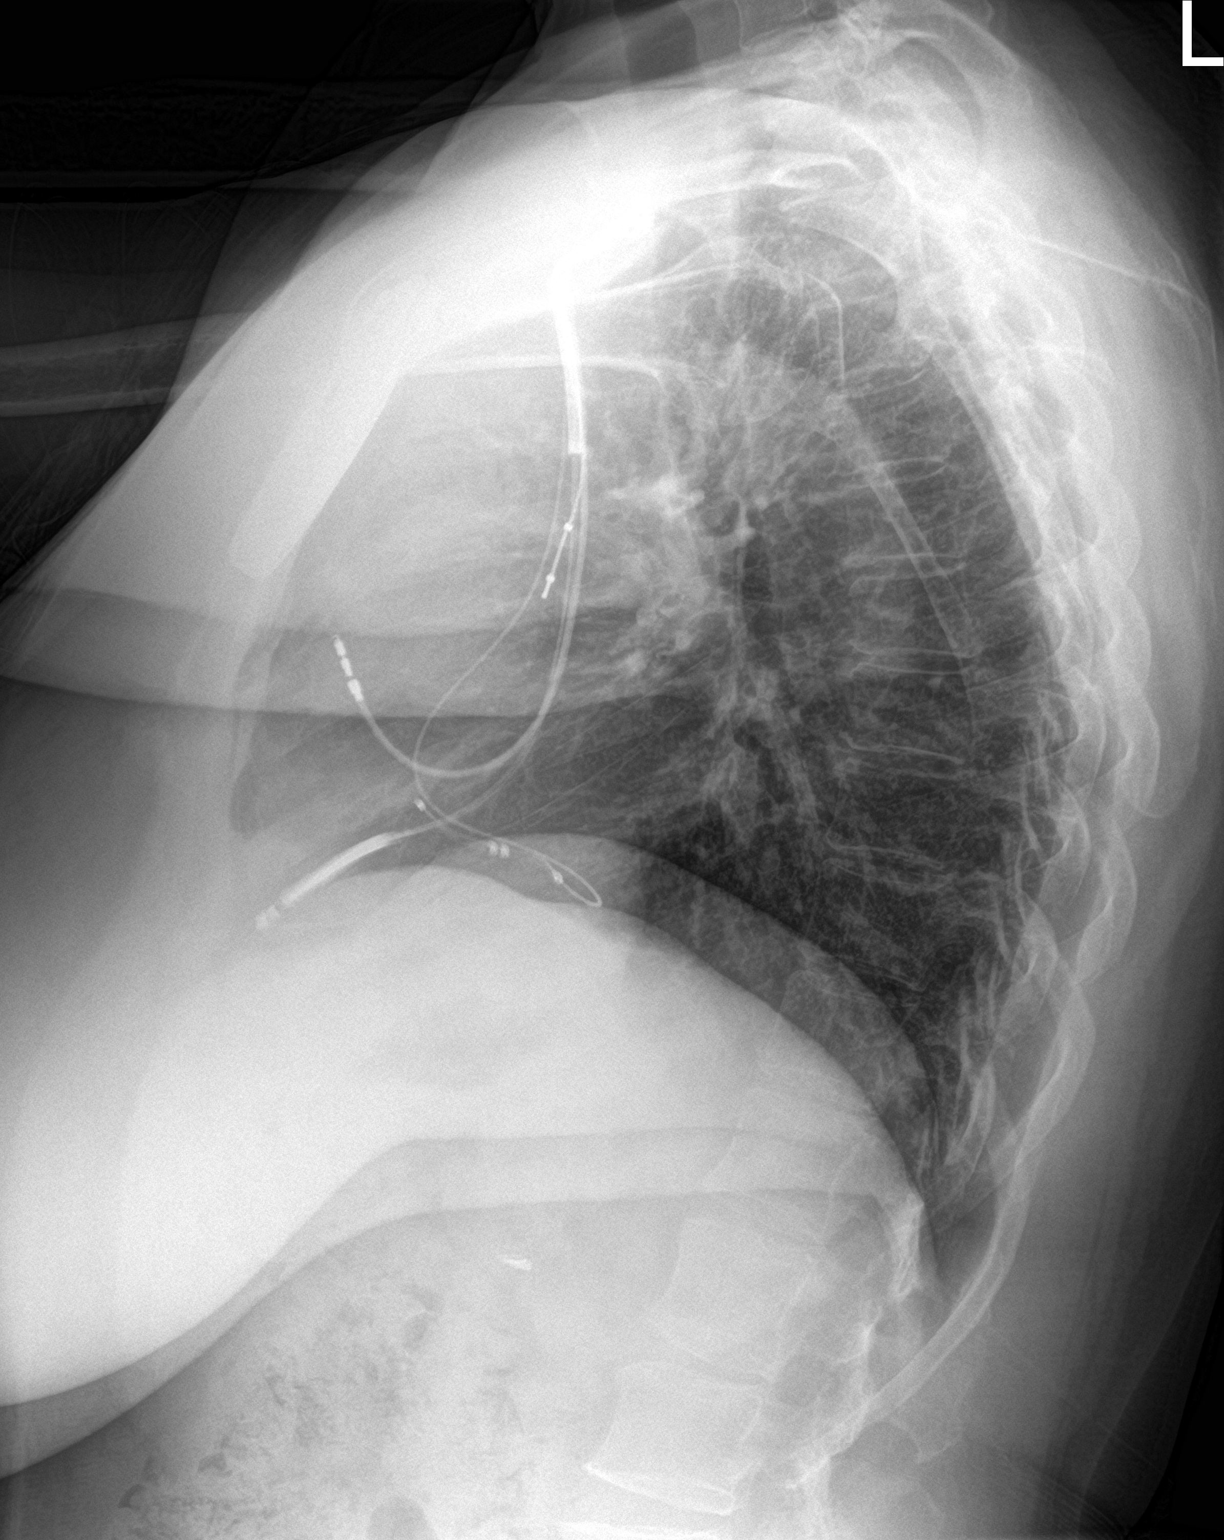

[2 of 2 positions shown; findings below may reference images not displayed]

FINDINGS: Cardiac shadows within normal limits. Defibrillator is again noted.
Some variation in the lead placement is noted of uncertain
significance. Clinical correlation is recommended. Lungs are well
aerated bilaterally. No acute bony abnormality is seen.
IMPRESSION: Variation in the defibrillator lead placement is noted. This is of
uncertain significance. Clinical correlation is recommended. No
other focal abnormality is noted.
